# Patient Record
Sex: Female | Born: 1970 | Hispanic: No | Smoking: Never smoker
Health system: Southern US, Community
[De-identification: ages and names within clinical notes are randomized; demographics above are authoritative.]

## PROBLEM LIST (undated history)

## (undated) DIAGNOSIS — D649 Anemia, unspecified: Secondary | ICD-10-CM

## (undated) DIAGNOSIS — R519 Headache, unspecified: Secondary | ICD-10-CM

## (undated) DIAGNOSIS — F419 Anxiety disorder, unspecified: Secondary | ICD-10-CM

## (undated) DIAGNOSIS — K219 Gastro-esophageal reflux disease without esophagitis: Secondary | ICD-10-CM

## (undated) DIAGNOSIS — R51 Headache: Secondary | ICD-10-CM

## (undated) DIAGNOSIS — T8859XA Other complications of anesthesia, initial encounter: Secondary | ICD-10-CM

## (undated) DIAGNOSIS — N281 Cyst of kidney, acquired: Secondary | ICD-10-CM

## (undated) DIAGNOSIS — Z889 Allergy status to unspecified drugs, medicaments and biological substances status: Secondary | ICD-10-CM

## (undated) DIAGNOSIS — Z87442 Personal history of urinary calculi: Secondary | ICD-10-CM

## (undated) DIAGNOSIS — T4145XA Adverse effect of unspecified anesthetic, initial encounter: Secondary | ICD-10-CM

## (undated) HISTORY — PX: TUBAL LIGATION: SHX77

---

## 2014-09-13 ENCOUNTER — Emergency Department
Admission: EM | Admit: 2014-09-13 | Discharge: 2014-09-13 | Disposition: A | Discharge status: Home / Self Care | Payer: Self-pay | Attending: Emergency Medicine | Admitting: Emergency Medicine

## 2014-09-13 ENCOUNTER — Encounter: Payer: Self-pay | Admitting: Urgent Care

## 2014-09-13 ENCOUNTER — Emergency Department: Payer: Self-pay

## 2014-09-13 DIAGNOSIS — N200 Calculus of kidney: Secondary | ICD-10-CM | POA: Insufficient documentation

## 2014-09-13 DIAGNOSIS — Z3202 Encounter for pregnancy test, result negative: Secondary | ICD-10-CM | POA: Insufficient documentation

## 2014-09-13 DIAGNOSIS — R109 Unspecified abdominal pain: Secondary | ICD-10-CM

## 2014-09-13 LAB — BASIC METABOLIC PANEL
Anion gap: 6 (ref 5–15)
BUN: 13 mg/dL (ref 6–20)
CALCIUM: 9.1 mg/dL (ref 8.9–10.3)
CO2: 26 mmol/L (ref 22–32)
CREATININE: 0.85 mg/dL (ref 0.44–1.00)
Chloride: 109 mmol/L (ref 101–111)
Glucose, Bld: 109 mg/dL — ABNORMAL HIGH (ref 65–99)
POTASSIUM: 3.8 mmol/L (ref 3.5–5.1)
Sodium: 141 mmol/L (ref 135–145)

## 2014-09-13 LAB — URINALYSIS COMPLETE WITH MICROSCOPIC (ARMC ONLY)
BACTERIA UA: NONE SEEN
Bilirubin Urine: NEGATIVE
Glucose, UA: NEGATIVE mg/dL
Ketones, ur: NEGATIVE mg/dL
Leukocytes, UA: NEGATIVE
Nitrite: NEGATIVE
Protein, ur: NEGATIVE mg/dL
Specific Gravity, Urine: 1.02 (ref 1.005–1.030)
pH: 5 (ref 5.0–8.0)

## 2014-09-13 LAB — CBC
HEMATOCRIT: 37.6 % (ref 35.0–47.0)
HEMOGLOBIN: 12.4 g/dL (ref 12.0–16.0)
MCH: 29.6 pg (ref 26.0–34.0)
MCHC: 33 g/dL (ref 32.0–36.0)
MCV: 89.8 fL (ref 80.0–100.0)
Platelets: 203 10*3/uL (ref 150–440)
RBC: 4.18 MIL/uL (ref 3.80–5.20)
RDW: 13.5 % (ref 11.5–14.5)
WBC: 6.2 10*3/uL (ref 3.6–11.0)

## 2014-09-13 LAB — POCT PREGNANCY, URINE: PREG TEST UR: NEGATIVE

## 2014-09-13 MED ORDER — SODIUM CHLORIDE 0.9 % IV BOLUS (SEPSIS)
1000.0000 mL | Freq: Once | INTRAVENOUS | Status: AC
  Administered 2014-09-13: 1000 mL via INTRAVENOUS

## 2014-09-13 MED ORDER — OXYCODONE-ACETAMINOPHEN 5-325 MG PO TABS: 1.0000 | ORAL_TABLET | Freq: Four times a day (QID) | ORAL | Status: DC | PRN

## 2014-09-13 MED ORDER — KETOROLAC TROMETHAMINE 30 MG/ML IJ SOLN
INTRAMUSCULAR | Status: AC
  Administered 2014-09-13: 30 mg via INTRAVENOUS
  Filled 2014-09-13: qty 1

## 2014-09-13 MED ORDER — KETOROLAC TROMETHAMINE 30 MG/ML IJ SOLN
30.0000 mg | Freq: Once | INTRAMUSCULAR | Status: AC
  Administered 2014-09-13: 30 mg via INTRAVENOUS

## 2014-09-13 NOTE — ED Notes (Signed)
Patient returned from CT

## 2014-09-13 NOTE — ED Notes (Signed)
Patient resting in stretcher. Respirations even and unlabored. No obvious distress. No needs/concerns verbalized at this time. Call bell within reach. Encouraged to call with needs. Will continue to monitor. 

## 2014-09-13 NOTE — ED Provider Notes (Signed)
[facility] Emergency Department Provider Note  ____________________________________________  Time seen: Approximately 671 AM  I have reviewed the triage vital signs and the nursing notes.   HISTORY  Chief Complaint Flank Pain   HPI Nysa Sarin is a 44 y.o. female who reports that she woke up tonight and went to the bathroom. She reports that when she laid back down she developed some right-sided flank pain. The patient reports that the pain moved from her flank around to her abdomen. She reports that she feels as though she had to urinate. The patient has never had this pain before. When she arrived the pain was severe but reports now that the pain is improved with 3 out of 10 in intensity. The patient denies any hematuria denies any pain with urination but has felt some nausea. Not take anything for pain and reports that there is nothing that makes it worse she came in for further evaluation and treatment of the symptoms.   History reviewed. No pertinent past medical history.  There are no active problems to display for this patient.   Past surgical history Bilateral tubal ligation   Current Outpatient Rx  Name  Route  Sig  Dispense  Refill  . oxyCODONE-acetaminophen (ROXICET) 5-325 MG per tablet   Oral   Take 1 tablet by mouth every 6 (six) hours as needed.   12 tablet   0     Allergies Review of patient's allergies indicates no known allergies.  No family history on file.  Social History History  Substance Use Topics  . Smoking status: Never Smoker   . Smokeless tobacco: Not on file  . Alcohol Use: No    Review of Systems Constitutional: No fever/chills Eyes: No visual changes. ENT: No sore throat. Cardiovascular: Denies chest pain. Respiratory: Denies shortness of breath. Gastrointestinal: Nausea with right flank pain and right abdominal pain  Genitourinary: Urgency with no  dysuria. Musculoskeletal: Flank pain Skin: Negative  for rash. Neurological: Negative for headaches,   10-point ROS otherwise negative.  ____________________________________________   PHYSICAL EXAM:  VITAL SIGNS: ED Triage Vitals  Enc Vitals Group     BP 09/13/14 0522 129/90 mmHg     Pulse Rate 09/13/14 0522 71     Resp 09/13/14 0522 22     Temp 09/13/14 0522 98.6 F (37 C)     Temp Source 09/13/14 0522 Oral     SpO2 09/13/14 0522 98 %     Weight 09/13/14 0522 160 lb (72.576 kg)     Height 09/13/14 0522 5\' 5"  (1.651 m)     Head Cir --      Peak Flow --      Pain Score 09/13/14 0522 10     Pain Loc --      Pain Edu? --      Excl. in [place]? --     Constitutional: Alert and oriented. Well appearing and in mild distress. Eyes: Conjunctivae are normal. PERRL. EOMI. Head: Atraumatic. Nose: No congestion/rhinnorhea. Mouth/Throat: Mucous membranes are moist.  Oropharynx non-erythematous. Cardiovascular: Normal rate, regular rhythm. Grossly normal heart sounds.  Good peripheral circulation. Respiratory: Normal respiratory effort.  No retractions. Lungs CTAB. Gastrointestinal: Soft and nontender. No distention. CVA tenderness, right side tenderness. Genitourinary: Deferred Musculoskeletal: No lower extremity tenderness nor edema.  No joint effusions. Neurologic:  Normal speech and language. No gross focal neurologic deficits are appreciated.  Skin:  Skin is warm, dry and intact. No rash noted. Psychiatric: Mood and affect are  normal.   ____________________________________________   LABS (all labs ordered are listed, but only abnormal results are displayed)  Labs Reviewed  BASIC METABOLIC PANEL - Abnormal; Notable for the following:    Glucose, Bld 109 (*)    All other components within normal limits  URINALYSIS COMPLETEWITH MICROSCOPIC ([facility] ONLY) - Abnormal; Notable for the following:    Color, Urine YELLOW (*)    APPearance HAZY (*)    Hgb urine dipstick 1+ (*)    Squamous Epithelial / LPF 6-30 (*)    All other  components within normal limits  CBC  POCT PREGNANCY, URINE   ____________________________________________  EKG  None ____________________________________________  RADIOLOGY  CT renal stone study: Punctate 1-2 mm calcification along the posterior bladder wall possibly recently passed stone, no hydronephrosis. Punctate bilateral nephrolithiasis, 3.8 cm low density lesion within the right hepatic lobe cannot be characterized without IV contrast but is higher density than atypical cyst should be further evaluated with CT or MRI, low-density lesions within the kidneys bilaterally most likely cyst but cannot be characterized without IV contrast ____________________________________________   PROCEDURES  Procedure(s) performed: None  Critical Care performed: No  ____________________________________________   INITIAL IMPRESSION / ASSESSMENT AND PLAN / ED COURSE  Pertinent labs & imaging results that were available during my care of the patient were reviewed by me and considered in my medical decision making (see chart for details).  This is a 44 year old female who comes in with some right flank pain that started this evening. The pain is improved some but I will give the patient some normal saline as well as Toradol. The patient's blood work is not significant for elevated creatinine or elevated white blood cell count. On the CT the patient does appear to have passed a small stone. I will discharge the patient with some pain medicine as she may still have some dull ache but since the stone is out of her ureter the symptoms should improve. The patient be discharged to follow-up with her primary care physician and instructed on the renal stone diet of increasing her hydration decreasing caffeine and things with calcium oxalate ____________________________________________   FINAL CLINICAL IMPRESSION(S) / ED DIAGNOSES  Final diagnoses:  Right flank pain  Kidney stone      Loney Hering, MD 09/13/14 [phone]

## 2014-09-13 NOTE — ED Notes (Signed)
Patient presents this morning with c/o RIGHT lower back pain with (+) radiation into flank and groin. (+) nausea reported.

## 2014-09-13 NOTE — Discharge Instructions (Signed)
Flank Pain Flank pain refers to pain that is located on the side of the body between the upper abdomen and the back. The pain may occur over a short period of time (acute) or may be long-term or reoccurring (chronic). It may be mild or severe. Flank pain can be caused by many things. CAUSES  Some of the more common causes of flank pain include:  Muscle strains.   Muscle spasms.   A disease of your spine (vertebral disk disease).   A lung infection (pneumonia).   Fluid around your lungs (pulmonary edema).   A kidney infection.   Kidney stones.   A very painful skin rash caused by the chickenpox virus (shingles).   Gallbladder disease.  Willard care will depend on the cause of your pain. In general,  Rest as directed by your caregiver.  Drink enough fluids to keep your urine clear or pale yellow.  Only take over-the-counter or prescription medicines as directed by your caregiver. Some medicines may help relieve the pain.  Tell your caregiver about any changes in your pain.  Follow up with your caregiver as directed. SEEK IMMEDIATE MEDICAL CARE IF:   Your pain is not controlled with medicine.   You have new or worsening symptoms.  Your pain increases.   You have abdominal pain.   You have shortness of breath.   You have persistent nausea or vomiting.   You have swelling in your abdomen.   You feel faint or pass out.   You have blood in your urine.  You have a fever or persistent symptoms for more than 2-3 days.  You have a fever and your symptoms suddenly get worse. MAKE SURE YOU:   Understand these instructions.  Will watch your condition.  Will get help right away if you are not doing well or get worse. Document Released: 04/24/2005 Document Revised: 11/26/2011 Document Reviewed: 10/16/2011 [facility] Patient Information 2015 [place], [place]. This information is not intended to replace advice given to you by your  health care provider. Make sure you discuss any questions you have with your health care provider.  Kidney Stones Kidney stones (urolithiasis) are deposits that form inside your kidneys. The intense pain is caused by the stone moving through the urinary tract. When the stone moves, the ureter goes into spasm around the stone. The stone is usually passed in the urine.  CAUSES   A disorder that makes certain neck glands produce too much parathyroid hormone (primary hyperparathyroidism).  A buildup of uric acid crystals, similar to gout in your joints.  Narrowing (stricture) of the ureter.  A kidney obstruction present at birth (congenital obstruction).  Previous surgery on the kidney or ureters.  Numerous kidney infections. SYMPTOMS   Feeling sick to your stomach (nauseous).  Throwing up (vomiting).  Blood in the urine (hematuria).  Pain that usually spreads (radiates) to the groin.  Frequency or urgency of urination. DIAGNOSIS   Taking a history and physical exam.  Blood or urine tests.  CT scan.  Occasionally, an examination of the inside of the urinary bladder (cystoscopy) is performed. TREATMENT   Observation.  Increasing your fluid intake.  Extracorporeal shock wave lithotripsy--This is a noninvasive procedure that uses shock waves to break up kidney stones.  Surgery may be needed if you have severe pain or persistent obstruction. There are various surgical procedures. Most of the procedures are performed with the use of small instruments. Only small incisions are needed to accommodate these instruments,  so recovery time is minimized. The size, location, and chemical composition are all important variables that will determine the proper choice of action for you. Talk to your health care provider to better understand your situation so that you will minimize the risk of injury to yourself and your kidney.  HOME CARE INSTRUCTIONS   Drink enough water and fluids to  keep your urine clear or pale yellow. This will help you to pass the stone or stone fragments.  Strain all urine through the provided strainer. Keep all particulate matter and stones for your health care provider to see. The stone causing the pain may be as small as a grain of salt. It is very important to use the strainer each and every time you pass your urine. The collection of your stone will allow your health care provider to analyze it and verify that a stone has actually passed. The stone analysis will often identify what you can do to reduce the incidence of recurrences.  Only take over-the-counter or prescription medicines for pain, discomfort, or fever as directed by your health care provider.  Make a follow-up appointment with your health care provider as directed.  Get follow-up X-rays if required. The absence of pain does not always mean that the stone has passed. It may have only stopped moving. If the urine remains completely obstructed, it can cause loss of kidney function or even complete destruction of the kidney. It is your responsibility to make sure X-rays and follow-ups are completed. Ultrasounds of the kidney can show blockages and the status of the kidney. Ultrasounds are not associated with any radiation and can be performed easily in a matter of minutes. SEEK MEDICAL CARE IF:  You experience pain that is progressive and unresponsive to any pain medicine you have been prescribed. SEEK IMMEDIATE MEDICAL CARE IF:   Pain cannot be controlled with the prescribed medicine.  You have a fever or shaking chills.  The severity or intensity of pain increases over 18 hours and is not relieved by pain medicine.  You develop a new onset of abdominal pain.  You feel faint or pass out.  You are unable to urinate. MAKE SURE YOU:   Understand these instructions.  Will watch your condition.  Will get help right away if you are not doing well or get worse. Document Released:  03/03/2005 Document Revised: 11/03/2012 Document Reviewed: 08/04/2012 [facility] Patient Information 2015 [place], [place]. This information is not intended to replace advice given to you by your health care provider. Make sure you discuss any questions you have with your health care provider.  Dietary Guidelines to Help Prevent Kidney Stones Your risk of kidney stones can be decreased by adjusting the foods you eat. The most important thing you can do is drink enough fluid. You should drink enough fluid to keep your urine clear or pale yellow. The following guidelines provide specific information for the type of kidney stone you have had. GUIDELINES ACCORDING TO TYPE OF KIDNEY STONE Calcium Oxalate Kidney Stones  Reduce the amount of salt you eat. Foods that have a lot of salt cause your body to release excess calcium into your urine. The excess calcium can combine with a substance called oxalate to form kidney stones.  Reduce the amount of animal protein you eat if the amount you eat is excessive. Animal protein causes your body to release excess calcium into your urine. Ask your dietitian how much protein from animal sources you should be eating.  Avoid foods  that are high in oxalates. If you take vitamins, they should have less than 500 mg of vitamin C. Your body turns vitamin C into oxalates. You do not need to avoid fruits and vegetables high in vitamin C. Calcium Phosphate Kidney Stones  Reduce the amount of salt you eat to help prevent the release of excess calcium into your urine.  Reduce the amount of animal protein you eat if the amount you eat is excessive. Animal protein causes your body to release excess calcium into your urine. Ask your dietitian how much protein from animal sources you should be eating.  Get enough calcium from food or take a calcium supplement (ask your dietitian for recommendations). Food sources of calcium that do not increase your risk of kidney stones  include:  Broccoli.  Dairy products, such as cheese and yogurt.  Pudding. Uric Acid Kidney Stones  Do not have more than 6 oz of animal protein per day. FOOD SOURCES Animal Protein Sources  Meat (all types).  Poultry.  Eggs.  Fish, seafood. Foods High in [employer] seasonings.  Soy sauce.  Teriyaki sauce.  Cured and processed meats.  Salted crackers and snack foods.  Fast food.  Canned soups and most canned foods. Foods High in Oxalates  Grains:  Amaranth.  Barley.  Grits.  Wheat germ.  Bran.  Buckwheat flour.  All bran cereals.  Pretzels.  Whole wheat bread.  Vegetables:  Beans (wax).  Beets and beet greens.  Collard greens.  Eggplant.  [place].  Leeks.  Okra.  Parsley.  [place]abagas.  Spinach.  Swiss chard.  Tomato paste.  Fried potatoes.  Sweet potatoes.  Fruits:  Red currants.  Figs.  Kiwi.  Rhubarb.  Meat and Other Protein Sources:  Beans (dried).  Soy burgers and other soybean products.  Miso.  Nuts (peanuts, almonds, pecans, cashews, hazelnuts).  Nut butters.  Sesame seeds and tahini (paste made of sesame seeds).  Poppy seeds.  Beverages:  Chocolate drink mixes.  Soy milk.  Instant iced tea.  Juices made from high-oxalate fruits or vegetables.  Other:  Carob.  Chocolate.  Fruitcake.  Marmalades. Document Released: 06/28/2010 Document Revised: 03/08/2013 Document Reviewed: 01/28/2013 [facility] Patient Information 2015 [place], [place]. This information is not intended to replace advice given to you by your health care provider. Make sure you discuss any questions you have with your health care provider.

## 2015-03-07 ENCOUNTER — Emergency Department
Admission: EM | Admit: 2015-03-07 | Discharge: 2015-03-07 | Disposition: A | Discharge status: Home / Self Care | Payer: No Typology Code available for payment source | Attending: Student | Admitting: Student

## 2015-03-07 ENCOUNTER — Encounter: Payer: Self-pay | Admitting: Emergency Medicine

## 2015-03-07 DIAGNOSIS — S161XXA Strain of muscle, fascia and tendon at neck level, initial encounter: Secondary | ICD-10-CM | POA: Insufficient documentation

## 2015-03-07 DIAGNOSIS — S4991XA Unspecified injury of right shoulder and upper arm, initial encounter: Secondary | ICD-10-CM | POA: Diagnosis not present

## 2015-03-07 DIAGNOSIS — S59902A Unspecified injury of left elbow, initial encounter: Secondary | ICD-10-CM | POA: Insufficient documentation

## 2015-03-07 DIAGNOSIS — Y92410 Unspecified street and highway as the place of occurrence of the external cause: Secondary | ICD-10-CM | POA: Diagnosis not present

## 2015-03-07 DIAGNOSIS — Y998 Other external cause status: Secondary | ICD-10-CM | POA: Insufficient documentation

## 2015-03-07 DIAGNOSIS — S4992XA Unspecified injury of left shoulder and upper arm, initial encounter: Secondary | ICD-10-CM | POA: Diagnosis not present

## 2015-03-07 DIAGNOSIS — Y9389 Activity, other specified: Secondary | ICD-10-CM | POA: Insufficient documentation

## 2015-03-07 DIAGNOSIS — S39012A Strain of muscle, fascia and tendon of lower back, initial encounter: Secondary | ICD-10-CM | POA: Diagnosis not present

## 2015-03-07 DIAGNOSIS — S199XXA Unspecified injury of neck, initial encounter: Secondary | ICD-10-CM | POA: Diagnosis present

## 2015-03-07 MED ORDER — TRAMADOL HCL 50 MG PO TABS: 50.0000 mg | ORAL_TABLET | Freq: Four times a day (QID) | ORAL | Status: AC | PRN

## 2015-03-07 MED ORDER — KETOROLAC TROMETHAMINE 60 MG/2ML IM SOLN
60.0000 mg | Freq: Once | INTRAMUSCULAR | Status: AC
  Administered 2015-03-07: 60 mg via INTRAMUSCULAR
  Filled 2015-03-07: qty 2

## 2015-03-07 MED ORDER — IBUPROFEN 800 MG PO TABS
800.0000 mg | ORAL_TABLET | Freq: Three times a day (TID) | ORAL | Status: DC | PRN
Start: 2015-03-07 — End: 2017-08-20

## 2015-03-07 MED ORDER — METHOCARBAMOL 750 MG PO TABS: 1500.0000 mg | ORAL_TABLET | Freq: Four times a day (QID) | ORAL | Status: DC
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
## 2015-03-07 NOTE — Discharge Instructions (Signed)
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
## 2016-12-28 ENCOUNTER — Encounter (HOSPITAL_COMMUNITY): Payer: Self-pay

## 2016-12-28 ENCOUNTER — Emergency Department (HOSPITAL_COMMUNITY)
Admission: EM | Admit: 2016-12-28 | Discharge: 2016-12-28 | Disposition: A | Discharge status: Home / Self Care | Payer: Self-pay | Attending: Emergency Medicine | Admitting: Emergency Medicine

## 2016-12-28 DIAGNOSIS — G43009 Migraine without aura, not intractable, without status migrainosus: Secondary | ICD-10-CM | POA: Insufficient documentation

## 2016-12-28 MED ORDER — METOCLOPRAMIDE HCL 5 MG/ML IJ SOLN
10.0000 mg | Freq: Once | INTRAMUSCULAR | Status: AC
  Administered 2016-12-28: 10 mg via INTRAVENOUS

## 2016-12-28 MED ORDER — SODIUM CHLORIDE 0.9 % IV BOLUS (SEPSIS)
1000.0000 mL | Freq: Once | INTRAVENOUS | Status: AC
  Administered 2016-12-28: 1000 mL via INTRAVENOUS

## 2016-12-28 MED ORDER — KETOROLAC TROMETHAMINE 15 MG/ML IJ SOLN
15.0000 mg | Freq: Once | INTRAMUSCULAR | Status: AC
  Administered 2016-12-28: 15 mg via INTRAVENOUS
  Filled 2016-12-28: qty 1

## 2016-12-28 MED ORDER — DIPHENHYDRAMINE HCL 50 MG/ML IJ SOLN
25.0000 mg | Freq: Once | INTRAMUSCULAR | Status: AC
  Administered 2016-12-28: 25 mg via INTRAVENOUS
  Filled 2016-12-28: qty 1

## 2016-12-28 MED ORDER — METOCLOPRAMIDE HCL 5 MG/ML IJ SOLN
5.0000 mg | Freq: Once | INTRAMUSCULAR | Status: DC
  Filled 2016-12-28: qty 2

## 2016-12-28 NOTE — Discharge Instructions (Signed)
Please establish a primary care physician.  Return to the ED for worsening symptoms as needed.

## 2016-12-28 NOTE — ED Triage Notes (Signed)
Patient here with complaint of migraine headache x 1 week with photophobia with mild nausea. Seen at an urgent cared yesterday and received toradol with minimal relief. Alert and oriented

## 2016-12-28 NOTE — ED Provider Notes (Signed)
[facility] DEPT Provider Note   CSN: [id number] Arrival date & time: 12/28/16  1236  History   Chief Complaint No chief complaint on file.  HPI Kristelle Cavallaro is a 46 y.o. female.  The patient is a 46yo female with a medical history significant for migraine headaches who presents to the ED complaining of a headache.  The patient typically has 2 migraines each month last 3-4 days. This headache has been present for the past 5 days with no relief of symptoms. The headache started in her left frontal region, and now has radiated to the right frontal region, jaw, and neck.  She took Excedrin yesterday with no relief. 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  Today's headache feels similar to her previous headaches. She reports mild photophobia and chills.  No significant neck stiffness or fever.  No weakness or numbness.   The history is provided by the patient and medical records. No language interpreter was used.   History reviewed. No pertinent past medical history.  There are no active problems to display for this patient.  History reviewed. No pertinent surgical history.  OB History    No data available     Home Medications    Prior to Admission medications   Medication Sig Start Date End Date Taking? Authorizing Provider  aspirin-acetaminophen-caffeine (EXCEDRIN MIGRAINE) [phone] MG tablet Take 2 tablets by mouth every 6 (six) hours as needed for headache or migraine.   Yes [provider]  ibuprofen (ADVIL,MOTRIN) 800 MG tablet Take 1 tablet (800 mg total) by mouth every 8 (eight) hours as needed. Patient not taking: Reported on 12/28/2016 03/07/15   Sable Feil, PA-C  methocarbamol (ROBAXIN-750) 750 MG tablet Take 2 tablets (1,500 mg total) by mouth 4 (four) times daily. Patient not taking: Reported on 12/28/2016 03/07/15   Sable Feil, PA-C  oxyCODONE-acetaminophen (ROXICET)  5-325 MG per tablet Take 1 tablet by mouth every 6 (six) hours as needed. Patient not taking: Reported on 12/28/2016 09/13/14   Loney Hering, MD   Family History No family history on file.  Social History Social History  Substance Use Topics  . Smoking status: Never Smoker  . Smokeless tobacco: Not on file  . Alcohol use No   Allergies   Patient has no known allergies.  Review of Systems Review of Systems  Constitutional: Negative for chills and fever.  HENT: Negative for ear pain and sore throat.   Eyes: Negative for pain and visual disturbance.  Respiratory: Negative for cough and shortness of breath.   Cardiovascular: Negative for chest pain and palpitations.  Gastrointestinal: Negative for abdominal pain and vomiting.  Genitourinary: Negative for decreased urine volume, dysuria and hematuria.  Musculoskeletal: Negative for arthralgias and back pain.  Skin: Negative for color change and rash.  Allergic/Immunologic: Negative for immunocompromised state.  Neurological: Positive for headaches. Negative for seizures, syncope and weakness.  Hematological: Negative.   Psychiatric/Behavioral: Negative.   All other systems reviewed and are negative.  Physical Exam Updated Vital Signs BP 108/65   Pulse 69   Temp 97.9 F (36.6 C) (Oral)   Resp 16   SpO2 100%   Physical Exam  Constitutional: She is oriented to person, place, and time. She appears well-developed and well-nourished. No distress.  HENT:  Head: Normocephalic and atraumatic.  Mouth/Throat: Oropharynx is clear and moist. No oropharyngeal exudate.  Eyes: Pupils are equal, round, and reactive  to light. Conjunctivae and EOM are normal.  Neck: Neck supple.  Cardiovascular: Normal rate, regular rhythm, normal heart sounds and intact distal pulses.   No murmur heard. Pulmonary/Chest: Effort normal and breath sounds normal. No stridor. No respiratory distress. She has no wheezes.  Abdominal: Soft. There is no  tenderness.  Musculoskeletal: She exhibits no edema.  Neurological: She is alert and oriented to person, place, and time. She displays normal reflexes. No cranial nerve deficit or sensory deficit. She exhibits normal muscle tone. Coordination normal.  Skin: Skin is warm and dry. Capillary refill takes less than 2 seconds.  Psychiatric: She has a normal mood and affect. Her behavior is normal. Judgment and thought content normal.  Nursing note and vitals reviewed.  ED Treatments / Results  Labs (all labs ordered are listed, but only abnormal results are displayed) Labs Reviewed - No data to display  EKG  EKG Interpretation None      Radiology No results found.  Procedures Procedures (including critical care time)  Medications Ordered in ED Medications  sodium chloride 0.9 % bolus 1,000 mL (0 mLs Intravenous Stopped 12/28/16 1823)  ketorolac (TORADOL) 15 MG/ML injection 15 mg (15 mg Intravenous Given 12/28/16 1658)  diphenhydrAMINE (BENADRYL) injection 25 mg (25 mg Intravenous Given 12/28/16 1657)  metoCLOPramide (REGLAN) injection 10 mg (10 mg Intravenous Given 12/28/16 1700)   Initial Impression / Assessment and Plan / ED Course  I have reviewed the triage vital signs and the nursing notes.  Pertinent labs & imaging results that were available during my care of the patient were reviewed by me and considered in my medical decision making (see chart for details).    Initial differential diagnosis included tension headache, migraine headache, cluster headache, electrolyte abnormality, and dehydration.  The patient was given a migraine cocktail for her symptoms that included IV fluids, Toradol, Benadryl, and Reglan.  Upon reassessment, her headache was "100 times better."  She continued to complain of pressure in her right frontal and temporal area, however she was ambulating without assistance and her photophobia had resolved.  Based on the above findings, I suspect the patient  was suffering from a migraine headache.  I discussed the above results with the patient who verbalized understanding.  Return precautions and follow-up plans discussed including the importance of establishing a PCP (she recently relocated back to [place] from [place]).  The patient was discharged in stable condition.  Final Clinical Impressions(s) / ED Diagnoses   Final diagnoses:  Migraine without aura and without status migrainosus, not intractable   New Prescriptions New Prescriptions   No medications on file     Charisse March, MD 12/28/16 Donney Dice, MD 12/31/16 [phone]

## 2017-08-20 ENCOUNTER — Other Ambulatory Visit: Payer: Self-pay

## 2017-08-20 ENCOUNTER — Emergency Department: Payer: BLUE CROSS/BLUE SHIELD

## 2017-08-20 ENCOUNTER — Emergency Department
Admission: EM | Admit: 2017-08-20 | Discharge: 2017-08-20 | Disposition: A | Discharge status: Home / Self Care | Payer: BLUE CROSS/BLUE SHIELD | Attending: Surgery | Admitting: Surgery

## 2017-08-20 DIAGNOSIS — R1013 Epigastric pain: Secondary | ICD-10-CM

## 2017-08-20 DIAGNOSIS — R112 Nausea with vomiting, unspecified: Secondary | ICD-10-CM | POA: Insufficient documentation

## 2017-08-20 DIAGNOSIS — R1011 Right upper quadrant pain: Secondary | ICD-10-CM | POA: Insufficient documentation

## 2017-08-20 DIAGNOSIS — K8050 Calculus of bile duct without cholangitis or cholecystitis without obstruction: Secondary | ICD-10-CM | POA: Insufficient documentation

## 2017-08-20 DIAGNOSIS — R109 Unspecified abdominal pain: Secondary | ICD-10-CM | POA: Diagnosis present

## 2017-08-20 LAB — COMPREHENSIVE METABOLIC PANEL
ALT: 15 U/L (ref 14–54)
ANION GAP: 11 (ref 5–15)
AST: 18 U/L (ref 15–41)
Albumin: 4 g/dL (ref 3.5–5.0)
Alkaline Phosphatase: 60 U/L (ref 38–126)
BUN: 15 mg/dL (ref 6–20)
CO2: 15 mmol/L — ABNORMAL LOW (ref 22–32)
Calcium: 8.7 mg/dL — ABNORMAL LOW (ref 8.9–10.3)
Chloride: 110 mmol/L (ref 101–111)
Creatinine, Ser: 0.64 mg/dL (ref 0.44–1.00)
GFR calc non Af Amer: >60 mL/min (ref >60)
Glucose, Bld: 116 mg/dL — ABNORMAL HIGH (ref 65–99)
Potassium: 3.7 mmol/L (ref 3.5–5.1)
Sodium: 136 mmol/L (ref 135–145)
Total Bilirubin: 0.5 mg/dL (ref 0.3–1.2)
Total Protein: 7.4 g/dL (ref 6.5–8.1)

## 2017-08-20 LAB — CBC WITH DIFFERENTIAL/PLATELET
Basophils Absolute: 0.1 10*3/uL (ref 0–0.1)
Basophils Relative: 1 %
EOS ABS: 0.3 10*3/uL (ref 0–0.7)
EOS PCT: 4 %
HCT: 39.4 % (ref 35.0–47.0)
Hemoglobin: 13.6 g/dL (ref 12.0–16.0)
LYMPHS ABS: 2.4 10*3/uL (ref 1.0–3.6)
LYMPHS PCT: 33 %
MCH: 30.8 pg (ref 26.0–34.0)
MCHC: 34.5 g/dL (ref 32.0–36.0)
MCV: 89.5 fL (ref 80.0–100.0)
MONOS PCT: 6 %
Monocytes Absolute: 0.4 10*3/uL (ref 0.2–0.9)
Neutro Abs: 4 10*3/uL (ref 1.4–6.5)
Neutrophils Relative %: 56 %
PLATELETS: 213 10*3/uL (ref 150–440)
RBC: 4.4 MIL/uL (ref 3.80–5.20)
RDW: 13.6 % (ref 11.5–14.5)
WBC: 7.2 10*3/uL (ref 3.6–11.0)

## 2017-08-20 LAB — TROPONIN I

## 2017-08-20 LAB — LIPASE, BLOOD: LIPASE: 29 U/L (ref 11–51)

## 2017-08-20 MED ORDER — PROMETHAZINE HCL 12.5 MG PO TABS: 12.5000 mg | ORAL_TABLET | Freq: Four times a day (QID) | ORAL | 0 refills | Status: DC | PRN

## 2017-08-20 MED ORDER — PROMETHAZINE HCL 25 MG/ML IJ SOLN
12.5000 mg | Freq: Four times a day (QID) | INTRAMUSCULAR | Status: DC | PRN
  Administered 2017-08-20: 12.5 mg via INTRAVENOUS
  Filled 2017-08-20: qty 1

## 2017-08-20 MED ORDER — KETOROLAC TROMETHAMINE 30 MG/ML IJ SOLN
15.0000 mg | Freq: Once | INTRAMUSCULAR | Status: AC
  Administered 2017-08-20: 15 mg via INTRAVENOUS
  Filled 2017-08-20: qty 1

## 2017-08-20 MED ORDER — HYDROCODONE-ACETAMINOPHEN 5-325 MG PO TABS: 1.0000 | ORAL_TABLET | ORAL | 0 refills | Status: DC | PRN

## 2017-08-20 MED ORDER — FENTANYL CITRATE (PF) 100 MCG/2ML IJ SOLN
50.0000 ug | INTRAMUSCULAR | Status: DC | PRN
  Administered 2017-08-20: 50 ug via INTRAVENOUS
  Filled 2017-08-20: qty 2

## 2017-08-20 MED ORDER — NAPROXEN 500 MG PO TABS: 500.0000 mg | ORAL_TABLET | Freq: Two times a day (BID) | ORAL | 0 refills | Status: AC

## 2017-08-20 NOTE — ED Notes (Signed)
PT unable to void at this time, will continue to monitor for urine sample

## 2017-08-20 NOTE — ED Notes (Signed)
Pt still unable to provide urine sample, pt states she had tubal ligation and denies any suspicion  of preg

## 2017-08-20 NOTE — ED Provider Notes (Signed)
[facility] Emergency Department Provider Note    First MD Initiated Contact with Patient 08/20/17 1006     (approximate)  I have reviewed the triage vital signs and the nursing notes.   HISTORY  Chief Complaint Abdominal Pain    HPI Michelle Hebert is a 47 y.o. female with a history of kidney stones presents with chief complaint of midepigastric crampy abdominal pain radiating to right upper quadrant and right flank.  Has never had pain like this before.  States that she started feeling over the past few days became more significant this morning.  Did have nausea with an episode of vomit.  Denies any fevers.  No chest pain or shortness of breath.  States the pain is currently mild.    History reviewed. No pertinent past medical history. No family history on file. Past Surgical History:  Procedure Laterality Date  . TUBAL LIGATION     There are no active problems to display for this patient.     Prior to Admission medications   Medication Sig Start Date End Date Taking? Authorizing Provider  HYDROcodone-acetaminophen (NORCO) 5-325 MG tablet Take 1 tablet by mouth every 4 (four) hours as needed for moderate pain. 08/20/17   Merlyn Lot, MD  naproxen (NAPROSYN) 500 MG tablet Take 1 tablet (500 mg total) by mouth 2 (two) times daily with a meal. 08/20/17 08/20/18  Merlyn Lot, MD  promethazine (PHENERGAN) 12.5 MG tablet Take 1 tablet (12.5 mg total) by mouth every 6 (six) hours as needed for nausea or vomiting. 08/20/17   Merlyn Lot, MD    Allergies Patient has no known allergies.    Social History Social History   Tobacco Use  . Smoking status: Never Smoker  . Smokeless tobacco: Never Used  Substance Use Topics  . Alcohol use: No  . Drug use: Not on file    Review of Systems Patient denies headaches, rhinorrhea, blurry vision, numbness, shortness of breath, chest pain, edema, cough, abdominal pain, nausea, vomiting, diarrhea,  dysuria, fevers, rashes or hallucinations unless otherwise stated above in HPI. ____________________________________________   PHYSICAL EXAM:  VITAL SIGNS: Vitals:   08/20/17 1002 08/20/17 1200  BP: 138/88   Pulse: 88 70  Resp: 18 16  Temp: 98.1 F (36.7 C)   SpO2: 100% 98%    Constitutional: Alert and oriented.  Eyes: Conjunctivae are normal.  Head: Atraumatic. Nose: No congestion/rhinnorhea. Mouth/Throat: Mucous membranes are moist.   Neck: No stridor. Painless ROM.  Cardiovascular: Normal rate, regular rhythm. Grossly normal heart sounds.  Good peripheral circulation. Respiratory: Normal respiratory effort.  No retractions. Lungs CTAB. Gastrointestinal: Soft and nontender. No distention. No abdominal bruits. No CVA tenderness. Genitourinary:  Musculoskeletal: No lower extremity tenderness nor edema.  No joint effusions. Neurologic:  Normal speech and language. No gross focal neurologic deficits are appreciated. No facial droop Skin:  Skin is warm, dry and intact. No rash noted. Psychiatric: Mood and affect are normal. Speech and behavior are normal.  ____________________________________________   LABS (all labs ordered are listed, but only abnormal results are displayed)  Results for orders placed or performed during the hospital encounter of 08/20/17 (from the past 24 hour(s))  CBC with Differential/Platelet     Status: None   Collection Time: 08/20/17 10:17 AM  Result Value Ref Range   WBC 7.2 3.6 - 11.0 K/uL   RBC 4.40 3.80 - 5.20 MIL/uL   Hemoglobin 13.6 12.0 - 16.0 g/dL   HCT 39.4 35.0 - 47.0 %  MCV 89.5 80.0 - 100.0 fL   MCH 30.8 26.0 - 34.0 pg   MCHC 34.5 32.0 - 36.0 g/dL   RDW 13.6 11.5 - 14.5 %   Platelets 213 150 - 440 K/uL   Neutrophils Relative % 56 %   Neutro Abs 4.0 1.4 - 6.5 K/uL   Lymphocytes Relative 33 %   Lymphs Abs 2.4 1.0 - 3.6 K/uL   Monocytes Relative 6 %   Monocytes Absolute 0.4 0.2 - 0.9 K/uL   Eosinophils Relative 4 %    Eosinophils Absolute 0.3 0 - 0.7 K/uL   Basophils Relative 1 %   Basophils Absolute 0.1 0 - 0.1 K/uL  Comprehensive metabolic panel     Status: Abnormal   Collection Time: 08/20/17 10:17 AM  Result Value Ref Range   Sodium 136 135 - 145 mmol/L   Potassium 3.7 3.5 - 5.1 mmol/L   Chloride 110 101 - 111 mmol/L   CO2 15 (L) 22 - 32 mmol/L   Glucose, Bld 116 (H) 65 - 99 mg/dL   BUN 15 6 - 20 mg/dL   Creatinine, Ser 0.64 0.44 - 1.00 mg/dL   Calcium 8.7 (L) 8.9 - 10.3 mg/dL   Total Protein 7.4 6.5 - 8.1 g/dL   Albumin 4.0 3.5 - 5.0 g/dL   AST 18 15 - 41 U/L   ALT 15 14 - 54 U/L   Alkaline Phosphatase 60 38 - 126 U/L   Total Bilirubin 0.5 0.3 - 1.2 mg/dL   GFR calc non Af Amer >60 >60 mL/min   GFR calc Af Amer >60 >60 mL/min   Anion gap 11 5 - 15  Lipase, blood     Status: None   Collection Time: 08/20/17 10:17 AM  Result Value Ref Range   Lipase 29 11 - 51 U/L  Troponin I     Status: None   Collection Time: 08/20/17 10:26 AM  Result Value Ref Range   Troponin I <0.03 <0.03 ng/mL   ____________________________________________  EKG My review and personal interpretation at Time: 10:41   Indication: abd pain  Rate: 80  Rhythm: sinus Axis: normal Other: normal intervals, no stemi ____________________________________________  RADIOLOGY  I personally reviewed all radiographic images ordered to evaluate for the above acute complaints and reviewed radiology reports and findings.  These findings were personally discussed with the patient.  Please see medical record for radiology report.  ____________________________________________   PROCEDURES  Procedure(s) performed:  Procedures    Critical Care performed: no ____________________________________________   INITIAL IMPRESSION / ASSESSMENT AND PLAN / ED COURSE  Pertinent labs & imaging results that were available during my care of the patient were reviewed by me and considered in my medical decision making (see chart for  details).   DDX: chollithiasis, cholecysitits, uti, stone, pna  Michelle Hebert is a 47 y.o. who presents to the ED with symptoms as described above.  Based on patient's presentation will order ultrasound to evaluate for cholelithiasis cholecystitis.  Will provide IV pain medication and reassess.  Blood work is otherwise reassuring.  Clinical Course as of Aug 20 1349  Thu Aug 20, 2017  1321 Right upper quadrant ultrasound shows evidence of large biliary stone but no evidence of pericholecystic fluid or edema.  Common bile duct within normal limits.  Patient tolerated sips of water.  Pain is currently controlled.  No evidence of acute cholecystitis at this time.   [PR]  1351 Patient stable and appropriate for outpatient follow-up.   [PR]  Clinical Course User Index [PR] Merlyn Lot, MD     As part of my medical decision making, I reviewed the following data within the [place] notes reviewed and incorporated, Labs reviewed, notes from prior ED visits and [place] Controlled Substance Database   ____________________________________________   FINAL CLINICAL IMPRESSION(S) / ED DIAGNOSES  Final diagnoses:  Epigastric abdominal pain  Biliary colic      NEW MEDICATIONS STARTED DURING THIS VISIT:  New Prescriptions   HYDROCODONE-ACETAMINOPHEN (NORCO) 5-325 MG TABLET    Take 1 tablet by mouth every 4 (four) hours as needed for moderate pain.   NAPROXEN (NAPROSYN) 500 MG TABLET    Take 1 tablet (500 mg total) by mouth 2 (two) times daily with a meal.   PROMETHAZINE (PHENERGAN) 12.5 MG TABLET    Take 1 tablet (12.5 mg total) by mouth every 6 (six) hours as needed for nausea or vomiting.     Note:  This document was prepared using Dragon voice recognition software and may include unintentional dictation errors.    Merlyn Lot, MD 08/20/17 1351

## 2017-08-20 NOTE — ED Notes (Signed)
Water given at this time for PO challenge, will continue to monitor

## 2017-08-20 NOTE — ED Triage Notes (Signed)
Pt c/o epigastric pain that radiates around into the right into the back since yesterday with nausea. Denies vomiting or diarrhea.

## 2017-08-20 NOTE — ED Notes (Signed)
Pt denies any nausea with PO challenge, MD awre

## 2017-09-04 ENCOUNTER — Other Ambulatory Visit: Payer: Self-pay

## 2017-09-09 ENCOUNTER — Ambulatory Visit (INDEPENDENT_AMBULATORY_CARE_PROVIDER_SITE_OTHER): Payer: BLUE CROSS/BLUE SHIELD | Admitting: Surgery

## 2017-09-09 ENCOUNTER — Encounter: Payer: Self-pay | Admitting: Surgery

## 2017-09-09 VITALS — BP 136/86 | HR 85 | Temp 97.7°F | Ht 64.0 in | Wt 190.4 lb

## 2017-09-09 DIAGNOSIS — K8020 Calculus of gallbladder without cholecystitis without obstruction: Secondary | ICD-10-CM | POA: Diagnosis not present

## 2017-09-09 NOTE — Progress Notes (Signed)
Michelle Hebert is an 47 y.o. female.   Chief Complaint: Epigastric pain Consult requested by emergency room physician HPI: This patient has been in the emergency room with epigastric pain her work-up showing gallstones. Twenty days ago in ED.Postprandial pain with FFI. History reviewed. No pertinent past medical history.  Past Surgical History:  Procedure Laterality Date  . TUBAL LIGATION    . TUBAL LIGATION     Mult family members with GB problems. Family History  Problem Relation Age of Onset  . Diabetes Mother   . Diverticulitis Mother   . Multiple sclerosis Mother   . Dystonia Mother   . Diabetes Maternal Grandmother   . Heart disease Maternal Grandmother   . Stroke Maternal Grandmother    Social History:  reports that she has never smoked. She has never used smokeless tobacco. She reports that she does not drink alcohol. Her drug history is not on file.  Allergies: No Known Allergies   (Not in a hospital admission)   Review of Systems:   Review of Systems  Constitutional: Negative.   HENT: Negative.   Eyes: Negative.   Respiratory: Negative.   Cardiovascular: Negative.   Gastrointestinal: Positive for abdominal pain and nausea. Negative for blood in stool, constipation, diarrhea, heartburn, melena and vomiting.  Genitourinary: Negative.   Musculoskeletal: Negative.   Skin: Negative.   Neurological: Negative.   Endo/Heme/Allergies: Negative.   Psychiatric/Behavioral: Negative.     Physical Exam:  Physical Exam  Constitutional: She is oriented to person, place, and time. She appears well-developed and well-nourished. No distress.  HENT:  Head: Normocephalic and atraumatic.  Eyes: Pupils are equal, round, and reactive to light. EOM are normal. Right eye exhibits no discharge. Left eye exhibits no discharge. No scleral icterus.  Neck: Normal range of motion. Neck supple.  Cardiovascular: Normal rate and regular rhythm.  Pulmonary/Chest: Effort normal and  breath sounds normal. No respiratory distress.  Abdominal: Soft. She exhibits no distension and no mass. There is no tenderness. There is no guarding.  Infraumbilical laparoscopy scar  Musculoskeletal: Normal range of motion. She exhibits no edema or deformity.  Neurological: She is alert and oriented to person, place, and time.  Skin: Skin is warm and dry. She is not diaphoretic. No erythema. No pallor.  Psychiatric: She has a normal mood and affect. Judgment normal.  Vitals reviewed.   Blood pressure 136/86, pulse 85, temperature 97.7 F (36.5 C), temperature source Oral, height 5\' 4"  (1.626 m), weight 190 lb 6.4 oz (86.4 kg).    No results found for this or any previous visit (from the past 48 hour(s)). No results found.   Assessment/Plan Ultrasound shows stones with no thickening of the gallbladder wall.  Liver function tests are normal. Symptomatic cholelithiasis.  Recommend laparoscopic cholecystectomy for control of her symptoms.  The rationale for this was discussed.  The options of observation reviewed and the risk of bleeding infection recurrence of symptoms failure to resolve all her symptoms conversion to an open procedure bile duct damage bile duct leak retained common bile duct stone in it which could require further surgery were all reviewed with her she understood and agreed with this plan.  This patient gives a family history of gallbladder cancer in her great grandmother and her grandmother on her mother side and in fact states that her mother might have had gallbladder cancer but was never treated for it other than cholecystectomy.  These are suspicious suggestions and while we must be concerned about gallbladder cancer it  would be unusual.  I also question when they were diagnosed and she said roughly in their 18s.  That is unlikely to be gallbladder cancer in my estimation.  However I did discuss the potential for finding gallbladder cancer and the need for a liver  resection at one point if that were to be identified but there was no sign of that on the ultrasound either.  He understood and agreed with plan for laparoscopic cholecystectomy for control of her symptoms. Florene Glen, MD, FACS

## 2017-09-09 NOTE — Patient Instructions (Signed)
You have requested to have your gallbladder removed. This will be done on 09/18/17 at [facility] with Dr. Burt Knack.  You will most likely be out of work 1-2 weeks for this surgery. You will return after your post-op appointment with a lifting restriction for approximately 4 more weeks.  You will be able to eat anything you would like to following surgery. But, start by eating a bland diet and advance this as tolerated. The Gallbladder diet is below, please go as closely by this diet as possible prior to surgery to avoid any further attacks.  Please see the (blue)pre-care form that you have been given today. If you have any questions, please call our office.  Laparoscopic Cholecystectomy Laparoscopic cholecystectomy is surgery to remove the gallbladder. The gallbladder is located in the upper right part of the abdomen, behind the liver. It is a storage sac for bile, which is produced in the liver. Bile aids in the digestion and absorption of fats. Cholecystectomy is often done for inflammation of the gallbladder (cholecystitis). This condition is usually caused by a buildup of gallstones (cholelithiasis) in the gallbladder. Gallstones can block the flow of bile, and that can result in inflammation and pain. In severe cases, emergency surgery may be required. If emergency surgery is not required, you will have time to prepare for the procedure. Laparoscopic surgery is an alternative to open surgery. Laparoscopic surgery has a shorter recovery time. Your common bile duct may also need to be examined during the procedure. If stones are found in the common bile duct, they may be removed. LET [facility] CARE PROVIDER KNOW ABOUT:  Any allergies you have.  All medicines you are taking, including vitamins, herbs, eye drops, creams, and over-the-counter medicines.  Previous problems you or members of your family have had with the use of anesthetics.  Any blood disorders you have.  Previous surgeries  you have had.    Any medical conditions you have. RISKS AND COMPLICATIONS Generally, this is a safe procedure. However, problems may occur, including:  Infection.  Bleeding.  Allergic reactions to medicines.  Damage to other structures or organs.  A stone remaining in the common bile duct.  A bile leak from the cyst duct that is clipped when your gallbladder is removed.  The need to convert to open surgery, which requires a larger incision in the abdomen. This may be necessary if your surgeon thinks that it is not safe to continue with a laparoscopic procedure. BEFORE THE PROCEDURE  Ask your health care provider about:  Changing or stopping your regular medicines. This is especially important if you are taking diabetes medicines or blood thinners.  Taking medicines such as aspirin and ibuprofen. These medicines can thin your blood. Do not take these medicines before your procedure if your health care provider instructs you not to.  Follow instructions from your health care provider about eating or drinking restrictions.  Let your health care provider know if you develop a cold or an infection before surgery.  Plan to have someone take you home after the procedure.  Ask your health care provider how your surgical site will be marked or identified.  You may be given antibiotic medicine to help prevent infection. PROCEDURE  To reduce your risk of infection:  Your health care team will wash or sanitize their hands.  Your skin will be washed with soap.  An IV tube may be inserted into one of your veins.  You will be given a medicine to make  you fall asleep (general anesthetic).  A breathing tube will be placed in your mouth.  The surgeon will make several small cuts (incisions) in your abdomen.  A thin, lighted tube (laparoscope) that has a tiny camera on the end will be inserted through one of the small incisions. The camera on the laparoscope will send a picture to  a TV screen (monitor) in the operating room. This will give the surgeon a good view inside your abdomen.  A gas will be pumped into your abdomen. This will expand your abdomen to give the surgeon more room to perform the surgery.  Other tools that are needed for the procedure will be inserted through the other incisions. The gallbladder will be removed through one of the incisions.  After your gallbladder has been removed, the incisions will be closed with stitches (sutures), staples, or skin glue.  Your incisions may be covered with a bandage (dressing). The procedure may vary among health care providers and hospitals. AFTER THE PROCEDURE  Your blood pressure, heart rate, breathing rate, and blood oxygen level will be monitored often until the medicines you were given have worn off.  You will be given medicines as needed to control your pain.   This information is not intended to replace advice given to you by your health care provider. Make sure you discuss any questions you have with your health care provider.   Document Released: 03/03/2005 Document Revised: 11/22/2014 Document Reviewed: 10/13/2012 Elsevier Interactive Patient Education 2016 [place] Diet for Gallbladder Conditions A low-fat diet can be helpful if you have pancreatitis or a gallbladder condition. With these conditions, your pancreas and gallbladder have trouble digesting fats. A healthy eating plan with less fat will help rest your pancreas and gallbladder and reduce your symptoms. WHAT DO I NEED TO KNOW ABOUT THIS DIET?  Eat a low-fat diet.  Reduce your fat intake to less than 20-30% of your total daily calories. This is less than 50-60 g of fat per day.  Remember that you need some fat in your diet. Ask your dietician what your daily goal should be.  Choose nonfat and low-fat healthy foods. Look for the words "nonfat," "low fat," or "fat free."  As a guide, look on the label and choose foods  with less than 3 g of fat per serving. Eat only one serving.  Avoid alcohol.  Do not smoke. If you need help quitting, talk with your health care provider.  Eat small frequent meals instead of three large heavy meals. WHAT FOODS CAN I EAT? Grains Include healthy grains and starches such as potatoes, wheat bread, fiber-rich cereal, and brown rice. Choose whole grain options whenever possible. In adults, whole grains should account for 45-65% of your daily calories.  Fruits and Vegetables Eat plenty of fruits and vegetables. Fresh fruits and vegetables add fiber to your diet. Meats and Other Protein Sources Eat lean meat such as chicken and pork. Trim any fat off of meat before cooking it. Eggs, fish, and beans are other sources of protein. In adults, these foods should account for 10-35% of your daily calories. Dairy Choose low-fat milk and dairy options. Dairy includes fat and protein, as well as calcium.  Fats and Oils Limit high-fat foods such as fried foods, sweets, baked goods, sugary drinks.  Other Creamy sauces and condiments, such as mayonnaise, can add extra fat. Think about whether or not you need to use them, or use smaller amounts or low fat options.  WHAT FOODS ARE NOT RECOMMENDED?  High fat foods, such as:  [employer].  Ice cream.  [place] toast.  Sweet rolls.  Pizza.  Cheese bread.  Foods covered with batter, butter, creamy sauces, or cheese.  Fried foods.  Sugary drinks and desserts.  Foods that cause gas or bloating   This information is not intended to replace advice given to you by your health care provider. Make sure you discuss any questions you have with your health care provider.   Document Released: 03/08/2013 Document Reviewed: 03/08/2013 Elsevier Interactive Patient Education [employer].

## 2017-09-10 ENCOUNTER — Telehealth: Payer: Self-pay | Admitting: Surgery

## 2017-09-10 NOTE — Telephone Encounter (Signed)
Pt advised of pre op date/time and sx date. Sx: 09/18/17 with Dr Maeola Sarah cholecystectomy.  Pre op: 09/11/17 between 1-5:00pm--phone interview.   Patient made aware to call [phone], between 1-3:00pm on 09/16/17, to find out what time to arrive.

## 2017-09-11 ENCOUNTER — Other Ambulatory Visit: Payer: Self-pay

## 2017-09-11 ENCOUNTER — Encounter
Admission: RE | Admit: 2017-09-11 | Discharge: 2017-09-11 | Disposition: A | Discharge status: Home / Self Care | Payer: BLUE CROSS/BLUE SHIELD | Source: Ambulatory Visit | Attending: Surgery | Admitting: Surgery

## 2017-09-11 HISTORY — DX: Anemia, unspecified: D64.9

## 2017-09-11 HISTORY — DX: Other complications of anesthesia, initial encounter: T88.59XA

## 2017-09-11 HISTORY — DX: Gastro-esophageal reflux disease without esophagitis: K21.9

## 2017-09-11 HISTORY — DX: Allergy status to unspecified drugs, medicaments and biological substances: Z88.9

## 2017-09-11 HISTORY — DX: Personal history of urinary calculi: Z87.442

## 2017-09-11 HISTORY — DX: Adverse effect of unspecified anesthetic, initial encounter: T41.45XA

## 2017-09-11 HISTORY — DX: Headache, unspecified: R51.9

## 2017-09-11 HISTORY — DX: Headache: R51

## 2017-09-11 HISTORY — DX: Anxiety disorder, unspecified: F41.9

## 2017-09-11 HISTORY — DX: Cyst of kidney, acquired: N28.1

## 2017-09-11 NOTE — Patient Instructions (Signed)
Your procedure is scheduled on: 09-18-17 Friday  Report to Same Day Surgery 2nd floor medical mall [facility] Entrance-take elevator on left to 2nd floor.  Check in with surgery information desk.) To find out your arrival time please call [phone] between 1PM - 3PM on 09-16-17 [facility]  Remember: Instructions that are not followed completely may result in serious medical risk, up to and including death, or upon the discretion of your surgeon and anesthesiologist your surgery may need to be rescheduled.    _x___ 1. Do not eat food after midnight the night before your procedure. NO GUM OR CANDY AFTER MIDNIGHT.  You may drink clear liquids up to 2 hours before you are scheduled to arrive at the hospital for your procedure.  Do not drink clear liquids within 2 hours of your scheduled arrival to the hospital.  Clear liquids include  --Water or Apple juice without pulp  --Clear carbohydrate beverage such as ClearFast or Gatorade  --Black Coffee or Clear Tea (No milk, no creamers, do not add anything to the coffee or Tea    __x__ 2. No Alcohol for 24 hours before or after surgery.   __x__3. No Smoking or e-cigarettes for 24 prior to surgery.  Do not use any chewable tobacco products for at least 6 hour prior to surgery   ____  4. Bring all medications with you on the day of surgery if instructed.    __x__ 5. Notify your doctor if there is any change in your medical condition     (cold, fever, infections).    x___6. On the morning of surgery brush your teeth with toothpaste and water.  You may rinse your mouth with mouth wash if you wish.  Do not swallow any toothpaste or mouthwash.   Do not wear jewelry, make-up, hairpins, clips or nail polish.  Do not wear lotions, powders, or perfumes. You may wear deodorant.  Do not shave 48 hours prior to surgery. Men may shave face and neck.  Do not bring valuables to the hospital.    [facility] is not responsible for any belongings or  valuables.               Contacts, dentures or bridgework may not be worn into surgery.  Leave your suitcase in the car. After surgery it may be brought to your room.  For patients admitted to the hospital, discharge time is determined by your treatment team.  _  Patients discharged the day of surgery will not be allowed to drive home.  You will need someone to drive you home and stay with you the night of your procedure.    Please read over the following fact sheets that you were given:   [facility] Preparing for Surgery   ____ Take anti-hypertensive listed below, cardiac, seizure, asthma, anti-reflux and psychiatric medicines. These include:  1. NONE  2.  3.  4.  5.  6.  ____Fleets enema or Magnesium Citrate as directed.   _x___ Use CHG Soap or sage wipes as directed on instruction sheet   ____ Use inhalers on the day of surgery and bring to hospital day of surgery  ____ Stop Metformin and Janumet 2 days prior to surgery.    ____ Take 1/2 of usual insulin dose the night before surgery and none on the morning surgery.   ____ Follow recommendations from Cardiologist, Pulmonologist or PCP regarding stopping Aspirin, Coumadin, Plavix ,Eliquis, Effient, or Pradaxa, and Pletal.  X____Stop Anti-inflammatories such as Advil,  Aleve, Ibuprofen, Motrin, Naproxen, Naprosyn, Goodies powders or aspirin products NOW-OK to take Tylenol OR HYDROCODONE IF NEEDED   ____ Stop supplements until after surgery.     ____ Bring C-Pap to the hospital.

## 2017-09-14 ENCOUNTER — Encounter
Admission: RE | Admit: 2017-09-14 | Discharge: 2017-09-14 | Disposition: A | Discharge status: Home / Self Care | Payer: BLUE CROSS/BLUE SHIELD | Source: Ambulatory Visit | Attending: Surgery | Admitting: Surgery

## 2017-09-14 DIAGNOSIS — Z01812 Encounter for preprocedural laboratory examination: Secondary | ICD-10-CM | POA: Diagnosis not present

## 2017-09-14 LAB — CBC WITH DIFFERENTIAL/PLATELET
BASOS ABS: 0.1 10*3/uL (ref 0–0.1)
Basophils Relative: 1 %
EOS ABS: 0.3 10*3/uL (ref 0–0.7)
Eosinophils Relative: 5 %
HCT: 37.4 % (ref 35.0–47.0)
HEMOGLOBIN: 12.8 g/dL (ref 12.0–16.0)
LYMPHS PCT: 32 %
Lymphs Abs: 2 10*3/uL (ref 1.0–3.6)
MCH: 30.6 pg (ref 26.0–34.0)
MCHC: 34.4 g/dL (ref 32.0–36.0)
MCV: 89 fL (ref 80.0–100.0)
Monocytes Absolute: 0.5 10*3/uL (ref 0.2–0.9)
Monocytes Relative: 8 %
NEUTROS PCT: 54 %
Neutro Abs: 3.3 10*3/uL (ref 1.4–6.5)
PLATELETS: 208 10*3/uL (ref 150–440)
RBC: 4.2 MIL/uL (ref 3.80–5.20)
RDW: 13 % (ref 11.5–14.5)
WBC: 6.2 10*3/uL (ref 3.6–11.0)

## 2017-09-14 LAB — COMPREHENSIVE METABOLIC PANEL
ALK PHOS: 59 U/L (ref 38–126)
ALT: 13 U/L (ref 0–44)
AST: 17 U/L (ref 15–41)
Albumin: 3.7 g/dL (ref 3.5–5.0)
Anion gap: 5 (ref 5–15)
BUN: 12 mg/dL (ref 6–20)
CALCIUM: 8.8 mg/dL — AB (ref 8.9–10.3)
CHLORIDE: 106 mmol/L (ref 98–111)
CO2: 28 mmol/L (ref 22–32)
Creatinine, Ser: 0.63 mg/dL (ref 0.44–1.00)
GFR calc Af Amer: >60 mL/min (ref >60)
GFR calc non Af Amer: >60 mL/min (ref >60)
GLUCOSE: 110 mg/dL — AB (ref 70–99)
Potassium: 3.8 mmol/L (ref 3.5–5.1)
SODIUM: 139 mmol/L (ref 135–145)
Total Bilirubin: 0.6 mg/dL (ref 0.3–1.2)
Total Protein: 6.9 g/dL (ref 6.5–8.1)

## 2017-09-17 MED ORDER — CEFAZOLIN SODIUM-DEXTROSE 2-4 GM/100ML-% IV SOLN
2.0000 g | INTRAVENOUS | Status: AC
  Administered 2017-09-18: 2 g via INTRAVENOUS

## 2017-09-18 ENCOUNTER — Ambulatory Visit
Admission: RE | Admit: 2017-09-18 | Discharge: 2017-09-18 | Disposition: A | Discharge status: Home / Self Care | Payer: BLUE CROSS/BLUE SHIELD | Source: Ambulatory Visit | Attending: Surgery | Admitting: Surgery

## 2017-09-18 ENCOUNTER — Ambulatory Visit: Payer: BLUE CROSS/BLUE SHIELD | Admitting: Certified Registered"

## 2017-09-18 ENCOUNTER — Encounter
Admission: RE | Disposition: A | Discharge status: Home / Self Care | Payer: Self-pay | Source: Ambulatory Visit | Attending: Surgery

## 2017-09-18 ENCOUNTER — Other Ambulatory Visit: Payer: Self-pay

## 2017-09-18 DIAGNOSIS — K8064 Calculus of gallbladder and bile duct with chronic cholecystitis without obstruction: Secondary | ICD-10-CM | POA: Diagnosis present

## 2017-09-18 DIAGNOSIS — K8020 Calculus of gallbladder without cholecystitis without obstruction: Secondary | ICD-10-CM

## 2017-09-18 HISTORY — PX: CHOLECYSTECTOMY: SHX55

## 2017-09-18 LAB — POCT PREGNANCY, URINE: Preg Test, Ur: NEGATIVE

## 2017-09-18 SURGERY — LAPAROSCOPIC CHOLECYSTECTOMY
Anesthesia: General | Wound class: Clean Contaminated

## 2017-09-18 MED ORDER — ONDANSETRON HCL 4 MG/2ML IJ SOLN
INTRAMUSCULAR | Status: AC
  Filled 2017-09-18: qty 2

## 2017-09-18 MED ORDER — FENTANYL CITRATE (PF) 100 MCG/2ML IJ SOLN
INTRAMUSCULAR | Status: AC
  Administered 2017-09-18: 25 ug via INTRAVENOUS
  Filled 2017-09-18: qty 2

## 2017-09-18 MED ORDER — MIDAZOLAM HCL 2 MG/2ML IJ SOLN
INTRAMUSCULAR | Status: AC
  Filled 2017-09-18: qty 2

## 2017-09-18 MED ORDER — FAMOTIDINE 20 MG PO TABS
20.0000 mg | ORAL_TABLET | Freq: Once | ORAL | Status: AC
  Administered 2017-09-18: 20 mg via ORAL

## 2017-09-18 MED ORDER — SUGAMMADEX SODIUM 200 MG/2ML IV SOLN
INTRAVENOUS | Status: DC | PRN
  Administered 2017-09-18: 175 mg via INTRAVENOUS

## 2017-09-18 MED ORDER — ROCURONIUM BROMIDE 100 MG/10ML IV SOLN
INTRAVENOUS | Status: DC | PRN
  Administered 2017-09-18: 40 mg via INTRAVENOUS

## 2017-09-18 MED ORDER — HYDROCODONE-ACETAMINOPHEN 5-325 MG PO TABS
1.0000 | ORAL_TABLET | Freq: Four times a day (QID) | ORAL | Status: DC | PRN
  Administered 2017-09-18: 1 via ORAL

## 2017-09-18 MED ORDER — LIDOCAINE HCL (PF) 2 % IJ SOLN
INTRAMUSCULAR | Status: AC
  Filled 2017-09-18: qty 10

## 2017-09-18 MED ORDER — DEXAMETHASONE SODIUM PHOSPHATE 10 MG/ML IJ SOLN
INTRAMUSCULAR | Status: DC | PRN
  Administered 2017-09-18: 8 mg via INTRAVENOUS

## 2017-09-18 MED ORDER — BUPIVACAINE-EPINEPHRINE (PF) 0.25% -1:200000 IJ SOLN
INTRAMUSCULAR | Status: DC | PRN
  Administered 2017-09-18: 30 mL via PERINEURAL

## 2017-09-18 MED ORDER — FAMOTIDINE 20 MG PO TABS
ORAL_TABLET | ORAL | Status: AC
  Administered 2017-09-18: 20 mg via ORAL
  Filled 2017-09-18: qty 1

## 2017-09-18 MED ORDER — FENTANYL CITRATE (PF) 100 MCG/2ML IJ SOLN
INTRAMUSCULAR | Status: AC
  Filled 2017-09-18: qty 2

## 2017-09-18 MED ORDER — HEPARIN SODIUM (PORCINE) 5000 UNIT/ML IJ SOLN
INTRAMUSCULAR | Status: AC
  Administered 2017-09-18: 5000 [IU] via SUBCUTANEOUS
  Filled 2017-09-18: qty 1

## 2017-09-18 MED ORDER — ROCURONIUM BROMIDE 50 MG/5ML IV SOLN
INTRAVENOUS | Status: AC
  Filled 2017-09-18: qty 1

## 2017-09-18 MED ORDER — HEPARIN SODIUM (PORCINE) 5000 UNIT/ML IJ SOLN
5000.0000 [IU] | Freq: Once | INTRAMUSCULAR | Status: AC
  Administered 2017-09-18: 5000 [IU] via SUBCUTANEOUS

## 2017-09-18 MED ORDER — DEXAMETHASONE SODIUM PHOSPHATE 10 MG/ML IJ SOLN
INTRAMUSCULAR | Status: AC
  Filled 2017-09-18: qty 1

## 2017-09-18 MED ORDER — ACETAMINOPHEN 10 MG/ML IV SOLN
INTRAVENOUS | Status: DC | PRN
  Administered 2017-09-18: 1000 mg via INTRAVENOUS

## 2017-09-18 MED ORDER — MIDAZOLAM HCL 2 MG/2ML IJ SOLN
INTRAMUSCULAR | Status: DC | PRN
  Administered 2017-09-18: 2 mg via INTRAVENOUS

## 2017-09-18 MED ORDER — HYDROCODONE-ACETAMINOPHEN 5-325 MG PO TABS: 1.0000 | ORAL_TABLET | Freq: Four times a day (QID) | ORAL | 0 refills | Status: DC | PRN

## 2017-09-18 MED ORDER — PROPOFOL 10 MG/ML IV BOLUS
INTRAVENOUS | Status: DC | PRN
  Administered 2017-09-18: 160 mg via INTRAVENOUS

## 2017-09-18 MED ORDER — FENTANYL CITRATE (PF) 100 MCG/2ML IJ SOLN
25.0000 ug | INTRAMUSCULAR | Status: DC | PRN
  Administered 2017-09-18 (×5): 25 ug via INTRAVENOUS

## 2017-09-18 MED ORDER — CEFAZOLIN SODIUM-DEXTROSE 2-4 GM/100ML-% IV SOLN
INTRAVENOUS | Status: AC
  Filled 2017-09-18: qty 100

## 2017-09-18 MED ORDER — SUGAMMADEX SODIUM 200 MG/2ML IV SOLN
INTRAVENOUS | Status: AC
  Filled 2017-09-18: qty 2

## 2017-09-18 MED ORDER — PROPOFOL 10 MG/ML IV BOLUS
INTRAVENOUS | Status: AC
  Filled 2017-09-18: qty 20

## 2017-09-18 MED ORDER — CHLORHEXIDINE GLUCONATE CLOTH 2 % EX PADS: 6.0000 | MEDICATED_PAD | Freq: Once | CUTANEOUS | Status: DC

## 2017-09-18 MED ORDER — LIDOCAINE HCL (CARDIAC) PF 100 MG/5ML IV SOSY
PREFILLED_SYRINGE | INTRAVENOUS | Status: DC | PRN
  Administered 2017-09-18: 100 mg via INTRAVENOUS

## 2017-09-18 MED ORDER — ONDANSETRON HCL 4 MG/2ML IJ SOLN: 4.0000 mg | Freq: Once | INTRAMUSCULAR | Status: DC | PRN

## 2017-09-18 MED ORDER — FENTANYL CITRATE (PF) 100 MCG/2ML IJ SOLN
INTRAMUSCULAR | Status: DC | PRN
  Administered 2017-09-18: 50 ug via INTRAVENOUS
  Administered 2017-09-18: 100 ug via INTRAVENOUS

## 2017-09-18 MED ORDER — ACETAMINOPHEN 10 MG/ML IV SOLN
INTRAVENOUS | Status: AC
  Filled 2017-09-18: qty 100

## 2017-09-18 MED ORDER — HYDROCODONE-ACETAMINOPHEN 5-325 MG PO TABS
ORAL_TABLET | ORAL | Status: AC
  Filled 2017-09-18: qty 1

## 2017-09-18 MED ORDER — ONDANSETRON HCL 4 MG/2ML IJ SOLN
INTRAMUSCULAR | Status: DC | PRN
  Administered 2017-09-18: 4 mg via INTRAVENOUS

## 2017-09-18 MED ORDER — BUPIVACAINE-EPINEPHRINE (PF) 0.25% -1:200000 IJ SOLN
INTRAMUSCULAR | Status: AC
  Filled 2017-09-18: qty 30

## 2017-09-18 MED ORDER — LACTATED RINGERS IV SOLN
INTRAVENOUS | Status: DC
  Administered 2017-09-18: 10:00:00 via INTRAVENOUS

## 2017-09-18 SURGICAL SUPPLY — 43 items
ADHESIVE MASTISOL STRL (MISCELLANEOUS) ×3 IMPLANT
APPLIER CLIP ROT 10 11.4 M/L (STAPLE) ×3
BLADE SURG SZ11 CARB STEEL (BLADE) ×3 IMPLANT
CANISTER SUCT 1200ML W/VALVE (MISCELLANEOUS) ×3 IMPLANT
CATH CHOLANGI 4FR 420404F (CATHETERS) IMPLANT
CHLORAPREP W/TINT 26ML (MISCELLANEOUS) ×3 IMPLANT
CLIP APPLIE ROT 10 11.4 M/L (STAPLE) ×1 IMPLANT
CLOSURE WOUND 1/2 X4 (GAUZE/BANDAGES/DRESSINGS) ×1
CONRAY 60ML FOR OR (MISCELLANEOUS) IMPLANT
DRAPE C-ARM XRAY 36X54 (DRAPES) IMPLANT
ELECT REM PT RETURN 9FT ADLT (ELECTROSURGICAL) ×3
ELECTRODE REM PT RTRN 9FT ADLT (ELECTROSURGICAL) ×1 IMPLANT
GLOVE BIO SURGEON STRL SZ8 (GLOVE) ×3 IMPLANT
GOWN STRL REUS W/ TWL LRG LVL3 (GOWN DISPOSABLE) ×4 IMPLANT
GOWN STRL REUS W/TWL LRG LVL3 (GOWN DISPOSABLE) ×8
IRRIGATION STRYKERFLOW (MISCELLANEOUS) ×1 IMPLANT
IRRIGATOR STRYKERFLOW (MISCELLANEOUS) ×3
IV CATH ANGIO 12GX3 LT BLUE (NEEDLE) ×3 IMPLANT
IV NS 1000ML (IV SOLUTION) ×2
IV NS 1000ML BAXH (IV SOLUTION) ×1 IMPLANT
JACKSON PRATT 10 (INSTRUMENTS) IMPLANT
KIT TURNOVER KIT A (KITS) ×3 IMPLANT
LABEL OR SOLS (LABEL) ×3 IMPLANT
NEEDLE HYPO 22GX1.5 SAFETY (NEEDLE) ×3 IMPLANT
NEEDLE VERESS 14GA 120MM (NEEDLE) ×3 IMPLANT
NS IRRIG 500ML POUR BTL (IV SOLUTION) ×3 IMPLANT
PACK LAP CHOLECYSTECTOMY (MISCELLANEOUS) ×3 IMPLANT
POUCH SPECIMEN RETRIEVAL 10MM (ENDOMECHANICALS) ×3 IMPLANT
SCISSORS METZENBAUM CVD 33 (INSTRUMENTS) ×3 IMPLANT
SLEEVE ENDOPATH XCEL 5M (ENDOMECHANICALS) ×6 IMPLANT
SPONGE GAUZE 2X2 8PLY STER LF (GAUZE/BANDAGES/DRESSINGS) ×4
SPONGE GAUZE 2X2 8PLY STRL LF (GAUZE/BANDAGES/DRESSINGS) ×8 IMPLANT
SPONGE LAP 18X18 RF (DISPOSABLE) ×3 IMPLANT
SPONGE VERSALON 4X4 4PLY (MISCELLANEOUS) IMPLANT
STRIP CLOSURE SKIN 1/2X4 (GAUZE/BANDAGES/DRESSINGS) ×2 IMPLANT
SUT MNCRL 4-0 (SUTURE) ×2
SUT MNCRL 4-0 27XMFL (SUTURE) ×1
SUT VICRYL 0 AB UR-6 (SUTURE) ×3 IMPLANT
SUTURE MNCRL 4-0 27XMF (SUTURE) ×1 IMPLANT
SYR 20CC LL (SYRINGE) ×3 IMPLANT
TROCAR XCEL NON-BLD 11X100MML (ENDOMECHANICALS) ×3 IMPLANT
TROCAR XCEL NON-BLD 5MMX100MML (ENDOMECHANICALS) ×3 IMPLANT
TUBING INSUFFLATION (TUBING) ×3 IMPLANT

## 2017-09-18 NOTE — Progress Notes (Signed)
Preoperative Review   Patient is met in the preoperative holding area. The history is reviewed in the chart and with the patient. I personally reviewed the options and rationale as well as the risks of this procedure that have been previously discussed with the patient. All questions asked by the patient and/or family were answered to their satisfaction.  Patient agrees to proceed with this procedure at this time.  Michelle Hebert E Yeni Jiggetts M.D. FACS  

## 2017-09-18 NOTE — Anesthesia Preprocedure Evaluation (Signed)
Anesthesia Evaluation  Patient identified by MRN, date of birth, ID band Patient awake    Reviewed: Allergy & Precautions, NPO status , Patient's Chart, lab work & pertinent test results  History of Anesthesia Complications (+) PROLONGED EMERGENCE and history of anesthetic complications  Airway Mallampati: II       Dental   Pulmonary neg sleep apnea, neg COPD,           Cardiovascular (-) hypertension(-) Past MI and (-) CHF (-) dysrhythmias (-) Valvular Problems/Murmurs     Neuro/Psych neg Seizures Anxiety    GI/Hepatic Neg liver ROS, GERD  ,  Endo/Other  neg diabetes  Renal/GU Renal disease (stones, cyst)     Musculoskeletal   Abdominal   Peds  Hematology  (+) anemia ,   Anesthesia Other Findings   Reproductive/Obstetrics                             Anesthesia Physical Anesthesia Plan  ASA: II  Anesthesia Plan: General   Post-op Pain Management:    Induction: Intravenous  PONV Risk Score and Plan: 3 and Dexamethasone, Ondansetron and Midazolam  Airway Management Planned: Oral ETT  Additional Equipment:   Intra-op Plan:   Post-operative Plan:   Informed Consent: I have reviewed the patients History and Physical, chart, labs and discussed the procedure including the risks, benefits and alternatives for the proposed anesthesia with the patient or authorized representative who has indicated his/her understanding and acceptance.     Plan Discussed with:   Anesthesia Plan Comments:         Anesthesia Quick Evaluation

## 2017-09-18 NOTE — Anesthesia Procedure Notes (Signed)

## 2017-09-18 NOTE — Anesthesia Post-op Follow-up Note (Signed)
Anesthesia QCDR form completed.        

## 2017-09-18 NOTE — Discharge Instructions (Signed)
Remove dressing in 24 hours. °May shower in 24 hours. °Leave paper strips in place. °Resume all home medications. °Follow-up with Dr. Cooper in 10 days. ° °AMBULATORY SURGERY  °DISCHARGE INSTRUCTIONS ° ° °1) The drugs that you were given will stay in your system until tomorrow so for the next 24 hours you should not: ° °A) Drive an automobile °B) Make any legal decisions °C) Drink any alcoholic beverage ° ° °2) You may resume regular meals tomorrow.  Today it is better to start with liquids and gradually work up to solid foods. ° °You may eat anything you prefer, but it is better to start with liquids, then soup and crackers, and gradually work up to solid foods. ° ° °3) Please notify your doctor immediately if you have any unusual bleeding, trouble breathing, redness and pain at the surgery site, drainage, fever, or pain not relieved by medication. ° ° ° °4) Additional Instructions: ° ° ° ° ° ° ° °Please contact your physician with any problems or Same Day Surgery at [phone], Monday through Friday 6 am to 4 pm, or [place] [facility] Main number at [phone]. °

## 2017-09-18 NOTE — Transfer of Care (Signed)
Immediate Anesthesia Transfer of Care Note  Patient: Michelle Hebert  Procedure(s) Performed: LAPAROSCOPIC CHOLECYSTECTOMY (N/A )  Patient Location: PACU  Anesthesia Type:General  Level of Consciousness: sedated and responds to stimulation  Airway & Oxygen Therapy: Patient Spontanous Breathing and Patient connected to face mask oxygen  Post-op Assessment: Report given to RN and Post -op Vital signs reviewed and stable  Post vital signs: Reviewed and stable  Last Vitals:  Vitals Value Taken Time  BP 120/82 09/18/2017 10:49 AM  Temp 36.2 C 09/18/2017 10:48 AM  Pulse 84 09/18/2017 10:49 AM  Resp 16 09/18/2017 10:49 AM  SpO2 100 % 09/18/2017 10:49 AM    Last Pain:  Vitals:   09/18/17 0927  TempSrc: Oral  PainSc: 0-No pain         Complications: No apparent anesthesia complications

## 2017-09-18 NOTE — Op Note (Signed)
Laparoscopic Cholecystectomy  Pre-operative Diagnosis: biliary colic  Post-operative Diagnosis: same  Procedure: laparascopic cholecystectomy  Surgeon: Jerrol Banana. Burt Knack, MD FACS  Anesthesia: Gen. with endotracheal tube  Assistant:RNFA  Procedure Details  The patient was seen again in the Holding Room. The benefits, complications, treatment options, and expected outcomes were discussed with the patient. The risks of bleeding, infection, recurrence of symptoms, failure to resolve symptoms, bile duct damage, bile duct leak, retained common bile duct stone, bowel injury, any of which could require further surgery and/or ERCP, stent, or papillotomy were reviewed with the patient. The likelihood of improving the patient's symptoms with return to their baseline status is good.  The patient and/or family concurred with the proposed plan, giving informed consent.  The patient was taken to Operating Room, identified as Michelle Hebert and the procedure verified as Laparoscopic Cholecystectomy.  A Time Out was held and the above information confirmed.  Prior to the induction of general anesthesia, antibiotic prophylaxis was administered. VTE prophylaxis was in place. General endotracheal anesthesia was then administered and tolerated well. After the induction, the abdomen was prepped with Chloraprep and draped in the sterile fashion. The patient was positioned in the supine position.  Local anesthetic  was injected into the skin near the umbilicus and an incision made. The Veress needle was placed. Pneumoperitoneum was then created with CO2 and tolerated well without any adverse changes in the patient's vital signs. A 96mm port was placed in the periumbilical position and the abdominal cavity was explored.  Two 5-mm ports were placed in the right upper quadrant and a 12 mm epigastric port was placed all under direct vision. All skin incisions  were infiltrated with a local anesthetic agent before making the  incision and placing the trocars.   The patient was positioned  in reverse Trendelenburg, tilted slightly to the patient's left.  The gallbladder was identified, the fundus grasped and retracted cephalad. Adhesions were lysed bluntly. The infundibulum was grasped and retracted laterally, exposing the peritoneum overlying the triangle of Calot. This was then divided and exposed in a blunt fashion. A critical view of the cystic duct and cystic artery was obtained.  The cystic duct was clearly identified and bluntly dissected.   Cystic lymphatics and cystic artery were doubly clipped and divided. Cystic duct was then doubly clipped and divided.  The gallbladder was taken from the gallbladder fossa in a retrograde fashion with the electrocautery. The gallbladder was removed and placed in an Endocatch bag. The liver bed was irrigated and inspected. Hemostasis was achieved with the electrocautery. Copious irrigation was utilized and was repeatedly aspirated until clear.  The gallbladder and Endocatch sac were then removed through the epigastric port site.   An arterial blleeder was noted in the GB fossa that was doubly clipped.  Inspection of the right upper quadrant was performed. No bleeding, bile duct injury or leak, or bowel injury was noted. Pneumoperitoneum was released.  The epigastric port site was closed with figure-of-eight 0 Vicryl sutures. 4-0 subcuticular Monocryl was used to close the skin. Steristrips and Mastisol and sterile dressings were  applied.  The patient was then extubated and brought to the recovery room in stable condition. Sponge, lap, and needle counts were correct at closure and at the conclusion of the case.   Findings: chronic Cholecystitis   Estimated Blood Loss: 25 cc         Drains: none         Specimens: Gallbladder  Complications: none               Michelle Tapscott E. Burt Knack, MD, FACS

## 2017-09-18 NOTE — Anesthesia Postprocedure Evaluation (Signed)
Anesthesia Post Note  Patient: Michelle Hebert  Procedure(s) Performed: LAPAROSCOPIC CHOLECYSTECTOMY (N/A )  Patient location during evaluation: PACU Anesthesia Type: General Level of consciousness: awake and alert Pain management: pain level controlled Vital Signs Assessment: post-procedure vital signs reviewed and stable Respiratory status: spontaneous breathing and respiratory function stable Cardiovascular status: stable Anesthetic complications: no     Last Vitals:  Vitals:   09/18/17 1127 09/18/17 1138  BP: 118/74 112/71  Pulse: 63 60  Resp: 15 16  Temp: 36.4 C (!) 36.3 C  SpO2: 100% 98%    Last Pain:  Vitals:   09/18/17 1138  TempSrc: Temporal  PainSc:                  KEPHART,WILLIAM K

## 2017-09-21 LAB — SURGICAL PATHOLOGY

## 2017-09-28 ENCOUNTER — Telehealth: Payer: Self-pay

## 2017-09-28 ENCOUNTER — Other Ambulatory Visit
Admission: RE | Admit: 2017-09-28 | Discharge: 2017-09-28 | Disposition: A | Discharge status: Home / Self Care | Payer: BLUE CROSS/BLUE SHIELD | Source: Ambulatory Visit | Attending: Surgery | Admitting: Surgery

## 2017-09-28 ENCOUNTER — Ambulatory Visit (INDEPENDENT_AMBULATORY_CARE_PROVIDER_SITE_OTHER): Payer: BLUE CROSS/BLUE SHIELD | Admitting: Surgery

## 2017-09-28 ENCOUNTER — Encounter: Payer: Self-pay | Admitting: Surgery

## 2017-09-28 VITALS — BP 138/80 | HR 94 | Temp 98.7°F | Wt 192.0 lb

## 2017-09-28 DIAGNOSIS — K8020 Calculus of gallbladder without cholecystitis without obstruction: Secondary | ICD-10-CM

## 2017-09-28 LAB — COMPREHENSIVE METABOLIC PANEL
ALBUMIN: 3.7 g/dL (ref 3.5–5.0)
ALT: 18 U/L (ref 0–44)
ANION GAP: 7 (ref 5–15)
AST: 16 U/L (ref 15–41)
Alkaline Phosphatase: 65 U/L (ref 38–126)
BUN: 9 mg/dL (ref 6–20)
CALCIUM: 8.6 mg/dL — AB (ref 8.9–10.3)
CO2: 26 mmol/L (ref 22–32)
Chloride: 106 mmol/L (ref 98–111)
Creatinine, Ser: 0.77 mg/dL (ref 0.44–1.00)
GFR calc Af Amer: >60 mL/min (ref >60)
GFR calc non Af Amer: >60 mL/min (ref >60)
GLUCOSE: 93 mg/dL (ref 70–99)
Potassium: 4.1 mmol/L (ref 3.5–5.1)
SODIUM: 139 mmol/L (ref 135–145)
Total Bilirubin: 0.6 mg/dL (ref 0.3–1.2)
Total Protein: 6.9 g/dL (ref 6.5–8.1)

## 2017-09-28 LAB — CBC
HEMATOCRIT: 36.4 % (ref 35.0–47.0)
Hemoglobin: 12.6 g/dL (ref 12.0–16.0)
MCH: 30.9 pg (ref 26.0–34.0)
MCHC: 34.5 g/dL (ref 32.0–36.0)
MCV: 89.5 fL (ref 80.0–100.0)
Platelets: 204 10*3/uL (ref 150–440)
RBC: 4.06 MIL/uL (ref 3.80–5.20)
RDW: 13.5 % (ref 11.5–14.5)
WBC: 7 10*3/uL (ref 3.6–11.0)

## 2017-09-28 LAB — LIPASE, BLOOD: Lipase: 27 U/L (ref 11–51)

## 2017-09-28 MED ORDER — ONDANSETRON HCL 4 MG PO TABS: 4.0000 mg | ORAL_TABLET | Freq: Three times a day (TID) | ORAL | 0 refills | Status: AC | PRN

## 2017-09-28 NOTE — Progress Notes (Signed)
Outpatient postop visit  09/28/2017  Michelle Hebert is an 47 y.o. female.    Procedure: Laparoscopic cholecystectomy  CC: Nausea and poor appetite  HPI: This patient status post laparoscopic cholecystectomy.  This patient describes nausea and poor appetite.  She feels bloated she has some mild right upper quadrant pain but nothing like her prior attacks.  He denies fevers or chills and has not vomited.  She is forcing herself to eat but it makes her feel uncomfortable.  Medications reviewed.    Physical Exam:  There were no vitals taken for this visit.    PE:  Vital signs reviewed and stable afebrile no icterus no jaundice abdomen soft nontender wounds are clean no erythema or drainage.  Guarding rebound or percussion tenderness  Assessment/Plan:  This patient status post lap scopic cholecystectomy.  Her pathology was reviewed confirming the diagnosis and need for surgery with no other signs of malignancy etc. Patient has some mild right upper quadrant pain and some nausea.  Her appetite is poor at this time.  I would like to get some liver function tests and a CBC and we will call her with those results.  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  We will call her with the results.  We will also phone in some Zofran for her nausea.  Florene Glen, MD, FACS

## 2017-09-28 NOTE — Telephone Encounter (Signed)
Called patient back to let her know that her labs were normal. But if she continued to have the same symptoms as today. Then she is to give [place] a call on Friday 10/02/2017. However, patient requested if she was okay to go back to work until next week on Monday 10/05/2017. I told her that it was fine. I told her that the letter will be left at the front desk per her request. Dr. Burt Knack had agreed on the disability certificate date. Patient had no further questions.

## 2017-09-28 NOTE — Patient Instructions (Signed)
Please go to the [facility] and have your blood drawn. We will call you with results.  Please got o your local pharmacy and pick up OTC Pepcid or Omeprazole to see if this helps you with your acid reflux and/or burping.  Please call us back in two days and let us know how you are feeling and depending on your labs as well, we will let you know when to return to work.   GENERAL POST-OPERATIVE PATIENT INSTRUCTIONS   WOUND CARE INSTRUCTIONS:  Keep a dry clean dressing on the wound if there is drainage. The initial bandage may be removed after 24 hours.  Once the wound has quit draining you may leave it open to air.  If clothing rubs against the wound or causes irritation and the wound is not draining you may cover it with a dry dressing during the daytime.  Try to keep the wound dry and avoid ointments on the wound unless directed to do so.  If the wound becomes bright red and painful or starts to drain infected material that is not clear, please contact your physician immediately.  If the wound is mildly pink and has a thick firm ridge underneath it, this is normal, and is referred to as a healing ridge.  This will resolve over the next 4-6 weeks.  BATHING: You may shower if you have been informed of this by your surgeon. However, Please do not submerge in a tub, hot tub, or pool until incisions are completely sealed or have been told by your surgeon that you may do so.  DIET:  You may eat any foods that you can tolerate.  It is a good idea to eat a high fiber diet and take in plenty of fluids to prevent constipation.  If you do become constipated you may want to take a mild laxative or take ducolax tablets on a daily basis until your bowel habits are regular.  Constipation can be very uncomfortable, along with straining, after recent surgery.  ACTIVITY:  You are encouraged to cough and deep breath or use your incentive spirometer if you were given one, every 15-30 minutes when awake.  This will  help prevent respiratory complications and low grade fevers post-operatively if you had a general anesthetic.  You may want to hug a pillow when coughing and sneezing to add additional support to the surgical area, if you had abdominal or chest surgery, which will decrease pain during these times.  You are encouraged to walk and engage in light activity for the next two weeks.  You should not lift more than 20 pounds, until 10/30/2017 as it could put you at increased risk for complications.  Twenty pounds is roughly equivalent to a plastic bag of groceries. At that time- Listen to your body when lifting, if you have pain when lifting, stop and then try again in a few days. Soreness after doing exercises or activities of daily living is normal as you get back in to your normal routine.  MEDICATIONS:  Try to take narcotic medications and anti-inflammatory medications, such as tylenol, ibuprofen, naprosyn, etc., with food.  This will minimize stomach upset from the medication.  Should you develop nausea and vomiting from the pain medication, or develop a rash, please discontinue the medication and contact your physician.  You should not drive, make important decisions, or operate machinery when taking narcotic pain medication.  SUNBLOCK Use sun block to incision area over the next year if this area will be  exposed to sun. This helps decrease scarring and will allow you avoid a permanent darkened area over your incision.  QUESTIONS:  Please feel free to call our office if you have any questions, and we will be glad to assist you. [phone]

## 2017-10-12 ENCOUNTER — Telehealth: Payer: Self-pay | Admitting: Surgery

## 2017-10-12 NOTE — Telephone Encounter (Signed)
Patient came by and dropped off fmla paperwork paperwork has been placed in folder and payment has been made. Patient would like to pick paperwork up,

## 2017-10-14 NOTE — Telephone Encounter (Signed)
FMLA paperwork completed. Patient notified to pick up due to not wanting it faxed. Copy made and placed in folder.

## 2018-10-05 ENCOUNTER — Emergency Department: Payer: Self-pay

## 2018-10-05 ENCOUNTER — Emergency Department
Admission: EM | Admit: 2018-10-05 | Discharge: 2018-10-05 | Disposition: A | Discharge status: Home / Self Care | Payer: Self-pay | Attending: Student in an Organized Health Care Education/Training Program | Admitting: Student in an Organized Health Care Education/Training Program

## 2018-10-05 ENCOUNTER — Other Ambulatory Visit: Payer: Self-pay

## 2018-10-05 ENCOUNTER — Encounter: Payer: Self-pay | Admitting: Emergency Medicine

## 2018-10-05 DIAGNOSIS — N938 Other specified abnormal uterine and vaginal bleeding: Secondary | ICD-10-CM | POA: Insufficient documentation

## 2018-10-05 DIAGNOSIS — D259 Leiomyoma of uterus, unspecified: Secondary | ICD-10-CM | POA: Insufficient documentation

## 2018-10-05 DIAGNOSIS — N939 Abnormal uterine and vaginal bleeding, unspecified: Secondary | ICD-10-CM

## 2018-10-05 LAB — CBC WITH DIFFERENTIAL/PLATELET
Abs Immature Granulocytes: 0.01 10*3/uL (ref 0.00–0.07)
Basophils Absolute: 0 10*3/uL (ref 0.0–0.1)
Basophils Relative: 1 %
Eosinophils Absolute: 0.2 10*3/uL (ref 0.0–0.5)
Eosinophils Relative: 4 %
HCT: 36.1 % (ref 36.0–46.0)
Hemoglobin: 12.2 g/dL (ref 12.0–15.0)
Immature Granulocytes: 0 %
Lymphocytes Relative: 43 %
Lymphs Abs: 2.4 10*3/uL (ref 0.7–4.0)
MCH: 30.2 pg (ref 26.0–34.0)
MCHC: 33.8 g/dL (ref 30.0–36.0)
MCV: 89.4 fL (ref 80.0–100.0)
Monocytes Absolute: 0.4 10*3/uL (ref 0.1–1.0)
Monocytes Relative: 7 %
Neutro Abs: 2.6 10*3/uL (ref 1.7–7.7)
Neutrophils Relative %: 45 %
Platelets: 206 10*3/uL (ref 150–400)
RBC: 4.04 MIL/uL (ref 3.87–5.11)
RDW: 12 % (ref 11.5–15.5)
WBC: 5.6 10*3/uL (ref 4.0–10.5)
nRBC: 0 % (ref 0.0–0.2)

## 2018-10-05 LAB — TYPE AND SCREEN
ABO/RH(D): A POS
Antibody Screen: NEGATIVE

## 2018-10-05 LAB — POCT PREGNANCY, URINE: Preg Test, Ur: NEGATIVE

## 2018-10-05 MED ORDER — TRANEXAMIC ACID 650 MG PO TABS
1300.0000 mg | ORAL_TABLET | Freq: Three times a day (TID) | ORAL | Status: DC
  Administered 2018-10-05: 17:00:00 1300 mg via ORAL
  Filled 2018-10-05 (×2): qty 2

## 2018-10-05 MED ORDER — TRANEXAMIC ACID 650 MG PO TABS: 1300.0000 mg | ORAL_TABLET | Freq: Three times a day (TID) | ORAL | 0 refills | Status: AC

## 2018-10-05 NOTE — ED Provider Notes (Signed)
[facility] Emergency Department Provider Note    First MD Initiated Contact with Patient 10/05/18 1229     (approximate)  I have reviewed the triage vital signs and the nursing notes.   HISTORY  Chief Complaint Vaginal Bleeding    HPI Michelle Hebert is a 48 y.o. female previously healthy status post tubal ligation several years ago not any birth control and does not smoke presents the ER for 4 days of heavy vaginal bleeding which she is states that she is passing clots and soaking through multiple tampons and pads daily.  Denies any fevers.  No pain.  Feels like a heavy period.  Did miss her last cycle this past month.  Was typically regular.  Denies any chance of being pregnant.  No nausea or vomiting.  She is on any blood thinners.  Does not have an OB/GYN.    Past Medical History:  Diagnosis Date  . Anemia   . Anxiety    no meds  . Complication of anesthesia    SHAKING UNCONTROLLABLY  . Cyst of kidney, acquired   . GERD (gastroesophageal reflux disease)    oc-tums  . H/O seasonal allergies   . Headache    migraines often-hormonal  . History of kidney stones    h/o   Family History  Problem Relation Age of Onset  . Diabetes Mother   . Diverticulitis Mother   . Multiple sclerosis Mother   . Dystonia Mother   . Diabetes Maternal Grandmother   . Heart disease Maternal Grandmother   . Stroke Maternal Grandmother    Past Surgical History:  Procedure Laterality Date  . CHOLECYSTECTOMY N/A 09/18/2017   Procedure: LAPAROSCOPIC CHOLECYSTECTOMY;  Surgeon: Florene Glen, MD;  Location: [facility] ORS;  Service: General;  Laterality: N/A;  . TUBAL LIGATION    . TUBAL LIGATION     Patient Active Problem List   Diagnosis Date Noted  . Calculus of gallbladder without cholecystitis without obstruction       Prior to Admission medications   Medication Sig Start Date End Date Taking? Authorizing Provider  fexofenadine (ALLEGRA) 180 MG tablet Take  180 mg by mouth daily as needed for allergies or rhinitis.    [provider]  fluticasone (FLONASE) 50 MCG/ACT nasal spray Place 2 sprays into both nostrils daily as needed for allergies.  06/15/17   [provider]  naproxen sodium (ALEVE) 220 MG tablet Take 220 mg by mouth 3 (three) times daily as needed (pain).    [provider]  ondansetron (ZOFRAN) 4 MG tablet Take 1 tablet (4 mg total) by mouth every 8 (eight) hours as needed for nausea or vomiting. 09/28/17   Florene Glen, MD  tranexamic acid (LYSTEDA) 650 MG TABS tablet Take 2 tablets (1,300 mg total) by mouth 3 (three) times daily for 4 days. Until bleeding decreases or a maximum of four days 10/05/18 10/09/18  Merlyn Lot, MD    Allergies Patient has no known allergies.    Social History Social History   Tobacco Use  . Smoking status: Never Smoker  . Smokeless tobacco: Never Used  Substance Use Topics  . Alcohol use: Yes    Comment: WINE OCC  . Drug use: Never    Review of Systems Patient denies headaches, rhinorrhea, blurry vision, numbness, shortness of breath, chest pain, edema, cough, abdominal pain, nausea, vomiting, diarrhea, dysuria, fevers, rashes or hallucinations unless otherwise stated above in HPI. ____________________________________________   PHYSICAL EXAM:  VITAL  SIGNS: Vitals:   10/05/18 1300 10/05/18 1315  BP:  127/84  Pulse: 71 64  Temp:    SpO2: 99% 97%    Constitutional: Alert and oriented.  Eyes: Conjunctivae are normal.  Head: Atraumatic. Nose: No congestion/rhinnorhea. Mouth/Throat: Mucous membranes are moist.   Neck: No stridor. Painless ROM.  Cardiovascular: Normal rate, regular rhythm. Grossly normal heart sounds.  Good peripheral circulation. Respiratory: Normal respiratory effort.  No retractions. Lungs CTAB. Gastrointestinal: Soft and nontender. No distention. No abdominal bruits. No CVA tenderness. Genitourinary: declined by patient  Musculoskeletal: No lower extremity tenderness nor edema.  No joint effusions. Neurologic:  Normal speech and language. No gross focal neurologic deficits are appreciated. No facial droop Skin:  Skin is warm, dry and intact. No rash noted. Psychiatric: Mood and affect are normal. Speech and behavior are normal.  ____________________________________________   LABS (all labs ordered are listed, but only abnormal results are displayed)  Results for orders placed or performed during the hospital encounter of 10/05/18 (from the past 24 hour(s))  Pregnancy, urine POC     Status: None   Collection Time: 10/05/18 11:14 AM  Result Value Ref Range   Preg Test, Ur NEGATIVE NEGATIVE  CBC with Differential/Platelet     Status: None   Collection Time: 10/05/18 12:49 PM  Result Value Ref Range   WBC 5.6 4.0 - 10.5 K/uL   RBC 4.04 3.87 - 5.11 MIL/uL   Hemoglobin 12.2 12.0 - 15.0 g/dL   HCT 36.1 36.0 - 46.0 %   MCV 89.4 80.0 - 100.0 fL   MCH 30.2 26.0 - 34.0 pg   MCHC 33.8 30.0 - 36.0 g/dL   RDW 12.0 11.5 - 15.5 %   Platelets 206 150 - 400 K/uL   nRBC 0.0 0.0 - 0.2 %   Neutrophils Relative % 45 %   Neutro Abs 2.6 1.7 - 7.7 K/uL   Lymphocytes Relative 43 %   Lymphs Abs 2.4 0.7 - 4.0 K/uL   Monocytes Relative 7 %   Monocytes Absolute 0.4 0.1 - 1.0 K/uL   Eosinophils Relative 4 %   Eosinophils Absolute 0.2 0.0 - 0.5 K/uL   Basophils Relative 1 %   Basophils Absolute 0.0 0.0 - 0.1 K/uL   Immature Granulocytes 0 %   Abs Immature Granulocytes 0.01 0.00 - 0.07 K/uL  Type and screen [facility]     Status: None   Collection Time: 10/05/18 12:49 PM  Result Value Ref Range   ABO/RH(D) A POS    Antibody Screen NEG    Sample Expiration      10/08/2018,2359 Performed at [facility], [address] [place], [place] [address]    ____________________________________________ ____________________________________________  RADIOLOGY  I personally reviewed all  radiographic images ordered to evaluate for the above acute complaints and reviewed radiology reports and findings.  These findings were personally discussed with the patient.  Please see medical record for radiology report.  ____________________________________________   PROCEDURES  Procedure(s) performed:  Procedures    Critical Care performed: no ____________________________________________   INITIAL IMPRESSION / ASSESSMENT AND PLAN / ED COURSE  Pertinent labs & imaging results that were available during my care of the patient were reviewed by me and considered in my medical decision making (see chart for details).   DDX: aub, dus, mass, ectopic, miscarriage  Lucille Witts is a 48 y.o. who presents to the ED with vaginal bleeding as described above.  She is nontoxic afebrile and hemodynamically stable.  Her abdominal exam is soft and benign.  Hemoglobin is stable.  Not on any blood thinners.  Ultrasound does show evidence of uterine fibroids.  She is not pregnant.  Recommended pelvic exam to further evaluate but patient has declined this.  Given her presenting symptoms will start on Lysteda after discussing alternative options including observation, initiating oral contraceptive medication, referral.  She agrees to follow-up with OB/GYN.  Have discussed with the patient and available family all diagnostics and treatments performed thus far and all questions were answered to the best of my ability. The patient demonstrates understanding and agreement with plan.      The patient was evaluated in Emergency Department today for the symptoms described in the history of present illness. He/she was evaluated in the context of the global COVID-19 pandemic, which necessitated consideration that the patient might be at risk for infection with the SARS-CoV-2 virus that causes COVID-19. Institutional protocols and algorithms that pertain to the evaluation of patients at risk for COVID-19 are in a  state of rapid change based on information released by regulatory bodies including the CDC and federal and state organizations. These policies and algorithms were followed during the patient's care in the ED.  As part of my medical decision making, I reviewed the following data within the [facility] notes reviewed and incorporated, Labs reviewed, notes from prior ED visits and [place] Controlled Substance Database   ____________________________________________   FINAL CLINICAL IMPRESSION(S) / ED DIAGNOSES  Final diagnoses:  Episode of heavy vaginal bleeding  Uterine leiomyoma, unspecified location      NEW MEDICATIONS STARTED DURING THIS VISIT:  New Prescriptions   TRANEXAMIC ACID (LYSTEDA) 650 MG TABS TABLET    Take 2 tablets (1,300 mg total) by mouth 3 (three) times daily for 4 days. Until bleeding decreases or a maximum of four days     Note:  This document was prepared using Dragon voice recognition software and may include unintentional dictation errors.    Merlyn Lot, MD 10/05/18 1600

## 2018-10-05 NOTE — ED Notes (Signed)
Pt stated she wasn't in pain at this time however she experiences abdominal cramping intermittenly

## 2018-10-05 NOTE — ED Triage Notes (Signed)
Heavy vaginal bleeding for 4 days with clots.

## 2021-04-02 DIAGNOSIS — G43909 Migraine, unspecified, not intractable, without status migrainosus: Secondary | ICD-10-CM | POA: Diagnosis not present

## 2021-04-03 ENCOUNTER — Other Ambulatory Visit: Payer: Self-pay

## 2021-04-03 ENCOUNTER — Emergency Department
Admission: EM | Admit: 2021-04-03 | Discharge: 2021-04-03 | Disposition: A | Discharge status: Home / Self Care | Payer: 59 | Attending: Emergency Medicine | Admitting: Emergency Medicine

## 2021-04-03 ENCOUNTER — Encounter: Payer: Self-pay | Admitting: Emergency Medicine

## 2021-04-03 ENCOUNTER — Emergency Department: Payer: 59

## 2021-04-03 DIAGNOSIS — R519 Headache, unspecified: Secondary | ICD-10-CM | POA: Diagnosis not present

## 2021-04-03 DIAGNOSIS — Z8679 Personal history of other diseases of the circulatory system: Secondary | ICD-10-CM | POA: Diagnosis not present

## 2021-04-03 DIAGNOSIS — Z87828 Personal history of other (healed) physical injury and trauma: Secondary | ICD-10-CM | POA: Diagnosis not present

## 2021-04-03 MED ORDER — LACTATED RINGERS IV BOLUS
1000.0000 mL | Freq: Once | INTRAVENOUS | Status: AC
  Administered 2021-04-03: 1000 mL via INTRAVENOUS

## 2021-04-03 MED ORDER — PROCHLORPERAZINE EDISYLATE 10 MG/2ML IJ SOLN
10.0000 mg | Freq: Once | INTRAMUSCULAR | Status: AC
  Administered 2021-04-03: 10 mg via INTRAVENOUS
  Filled 2021-04-03: qty 2

## 2021-04-03 MED ORDER — ACETAMINOPHEN 500 MG PO TABS
1000.0000 mg | ORAL_TABLET | Freq: Once | ORAL | Status: AC
  Administered 2021-04-03: 1000 mg via ORAL
  Filled 2021-04-03: qty 2

## 2021-04-03 MED ORDER — MAGNESIUM SULFATE 2 GM/50ML IV SOLN
2.0000 g | Freq: Once | INTRAVENOUS | Status: AC
  Administered 2021-04-03: 2 g via INTRAVENOUS
  Filled 2021-04-03: qty 50

## 2021-04-03 NOTE — ED Triage Notes (Signed)
Pt comes into the ED via POV c/o migraine that has been ongoing since yesterday.  Pt states that migraines are becoming more frequent.  Pt did go to UC yesterday and they gave her an injection, but she woke up and the pain was intense again.  Pt neurologically intact at this time.  Pt admits to light sensitivity and nausea.

## 2021-04-03 NOTE — ED Provider Notes (Signed)
[facility] Provider Note    Event Date/Time   First MD Initiated Contact with Patient 04/03/21 [phone]     (approximate)   History   Migraine   HPI  Michelle Hebert is a 51 y.o. female with a past medical history of GERD, anxiety, anemia and migraine headaches who presents for assessment of right-sided headache associate with some photophobia with headache radiating towards the right side of the back of the head and upper neck.  Patient states this is typical of her usual migraine headaches although they have been flip-flopping of the last couple months.  She does feel like her headaches have been increasing in frequency.  She states her current headache started yesterday while she was at work fairly suddenly.  She went to urgent care and received some Toradol which seemed to help quite a bit but never completely alleviated her headache although she feels it is worse again today.  She denies any recent head injuries, fevers, vision changes, vertigo, extremity weakness numbness or tingling, chest pain, cough, shortness of breath, back pain, illicit drug use, daily EtOH use or other acute complaints.  She has never been treated with triptans or seeing a neurologist.  Her last menstrual period she thinks was around September of last year.      Physical Exam  Triage Vital Signs: ED Triage Vitals  Enc Vitals Group     BP 04/03/21 0813 131/87     Pulse Rate 04/03/21 0813 84     Resp 04/03/21 0813 17     Temp 04/03/21 0813 97.6 F (36.4 C)     Temp Source 04/03/21 0813 Oral     SpO2 04/03/21 0813 98 %     Weight 04/03/21 0804 199 lb 15.3 oz (90.7 kg)     Height 04/03/21 0804 5\' 4"  (1.626 m)     Head Circumference --      Peak Flow --      Pain Score 04/03/21 0804 8     Pain Loc --      Pain Edu? --      Excl. in [place]? --     Most recent vital signs: Vitals:   04/03/21 0813 04/03/21 0830  BP: 131/87 (!) 128/97  Pulse: 84 85  Resp: 17   Temp: 97.6 F (36.4  C)   SpO2: 98% 99%    General: Awake, appears mildly uncomfortable and photophobic. CV:  Good peripheral perfusion.  2+ radial pulses. Resp:  Normal effort.  Clear bilaterally. Abd:  No distention.  Other:  Cranial nerves II through XII grossly intact.  No pronator drift.  No finger dysmetria.  Symmetric 5/5 strength of all extremities.  Sensation intact to light touch in all extremities.  Unremarkable unassisted gait.  Oropharynx is unremarkable.  There is some tenderness and fairly tight muscles around the left posterior occiput without any overlying skin changes.  No tenderness along the C-spine or overlying skin changes.    ED Results / Procedures / Treatments  Labs (all labs ordered are listed, but only abnormal results are displayed) Labs Reviewed - No data to display   EKG    RADIOLOGY CT head ordered and reviewed by myself shows no evidence of ischemia, hemorrhage, mass-effect, edema, ventriculomegaly or other acute intracranial process.  Also reviewed radiology interpretation and agree with their findings.   PROCEDURES:  Critical Care performed: No  Procedures    MEDICATIONS ORDERED IN ED: Medications  magnesium sulfate IVPB 2 g 50 mL (  2 g Intravenous New Bag/Given 04/03/21 0840)  lactated ringers bolus 1,000 mL (1,000 mLs Intravenous New Bag/Given 04/03/21 0837)  prochlorperazine (COMPAZINE) injection 10 mg (10 mg Intravenous Given 04/03/21 0838)  acetaminophen (TYLENOL) tablet 1,000 mg (1,000 mg Oral Given 04/03/21 5366)     IMPRESSION / MDM / ASSESSMENT AND PLAN / ED COURSE  I reviewed the triage vital signs and the nursing notes.                              Differential diagnosis includes, but is not limited to migraine versus tension headache with lower suspicion at this time based on history, exam and CT for SAH, meningitis, venous sinus fibrosis, or other deep space infection of the head or neck.  Also have a lower suspicion for temporal arteritis at  this time.  I reviewed patient's clinic notes from her clinic visit yesterday when she received Toradol for migraine.  No other acute abnormalities noted at that time patient significant provement in her headache after Toradol.  CT head ordered and reviewed by myself shows no evidence of ischemia, hemorrhage, mass-effect, edema, ventriculomegaly or other acute intracranial process.  Also reviewed radiology interpretation and agree with their findings.  Patient received some magnesium Compazine and fluids while in emergency room.  On my reassessment she is feeling much better.  Suspect likely tension versus migraine headache.  Low suspicion for other immediate life-threatening process.  Discharged in stable condition.  Strict return precautions advised and discussed.      FINAL CLINICAL IMPRESSION(S) / ED DIAGNOSES   Final diagnoses:  Nonintractable headache, unspecified chronicity pattern, unspecified headache type     Rx / DC Orders   ED Discharge Orders     None        Note:  This document was prepared using Dragon voice recognition software and may include unintentional dictation errors.   Lucrezia Starch, MD 04/03/21 [phone]

## 2021-04-03 NOTE — ED Notes (Signed)
Pt states that her headaches have increased in frequency, states that she has been having them daily for the past 2 months, pt states that yesterday her headache had a sudden onset and the pain was severe, states that they usually gradually increase in pain and severity but yesterday's was different, states that she went to urgent care and got a shot of toradol, states that it helped but the pain never went away completely and woke up again this am with the headache that never went away from yesterday. Pt states that she has never been seen by a neurologist and states that she has never had a ct of her head, pt states that when the pain started yesterday it was the top of her head and the right side of her head and reports feeling tingly in her left arm

## 2023-04-18 IMAGING — CT CT HEAD W/O CM
4 series · 17 of 47 positions shown, 19 images · non-contrast
Comparison: None.

CLINICAL DATA: Subarachnoid hemorrhage, worsening headaches



[Series 2: head wo · axial · 0.40mm/px · z∈[+375,+490]mm · 7 of 31 slices shown, 9 images]
[im 4/31  brain]
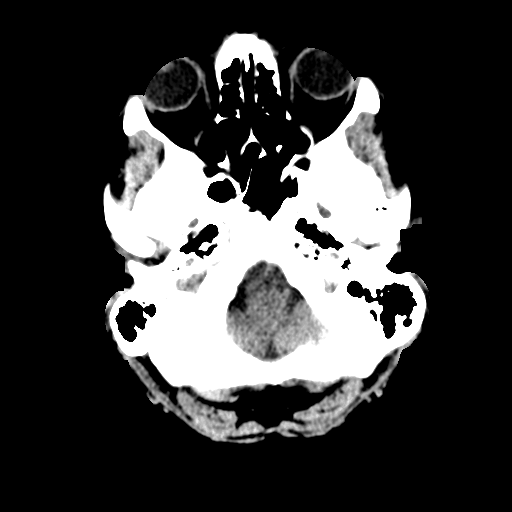
[im 4/31  bone]
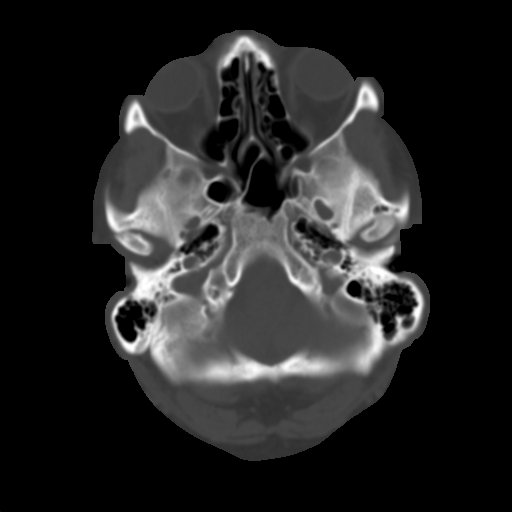
[im 8/31  brain]
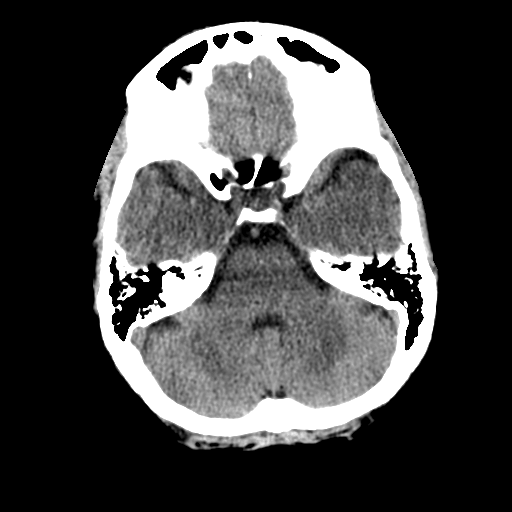
[im 12/31  brain]
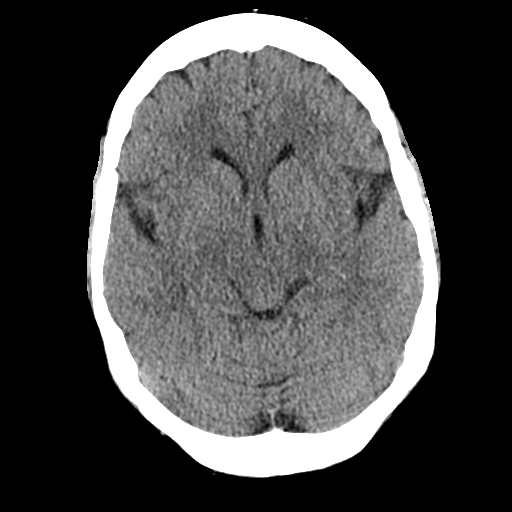
[im 16/31  brain]
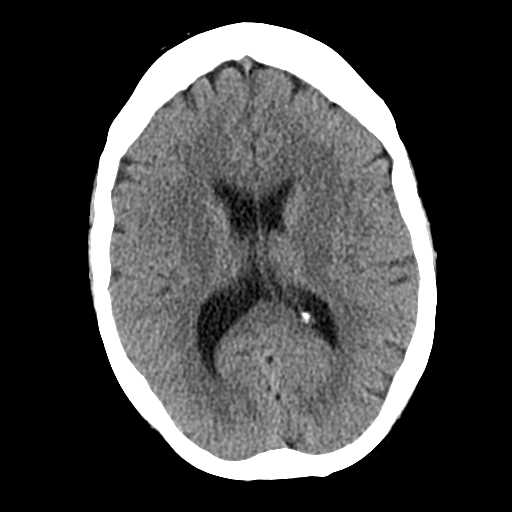
[im 19/31  brain]
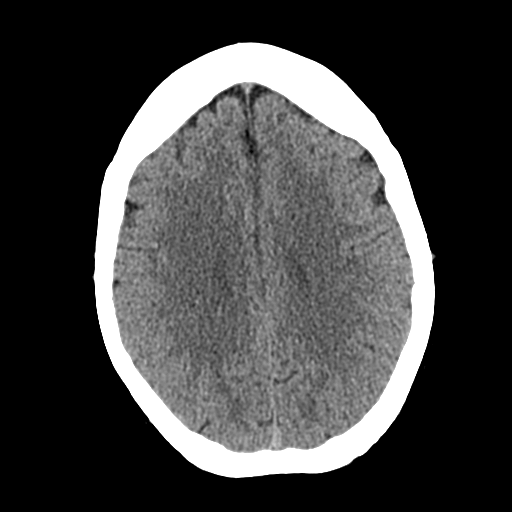
[im 19/31  bone]
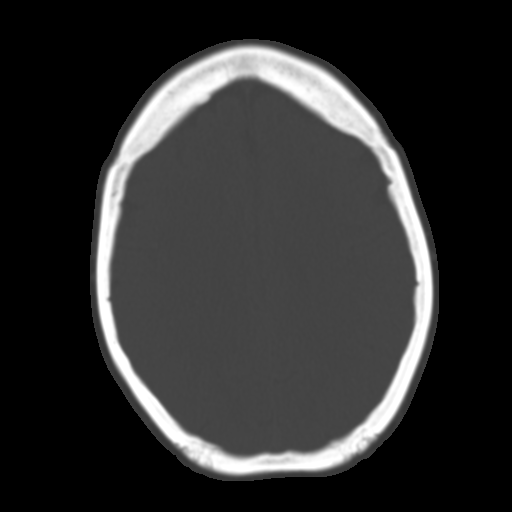
[im 23/31  brain]
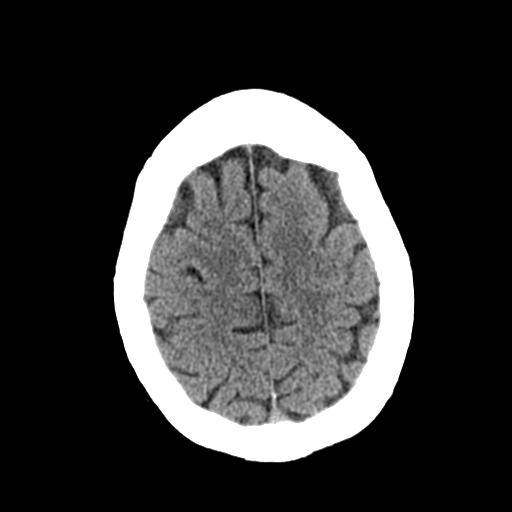
[im 27/31  brain]
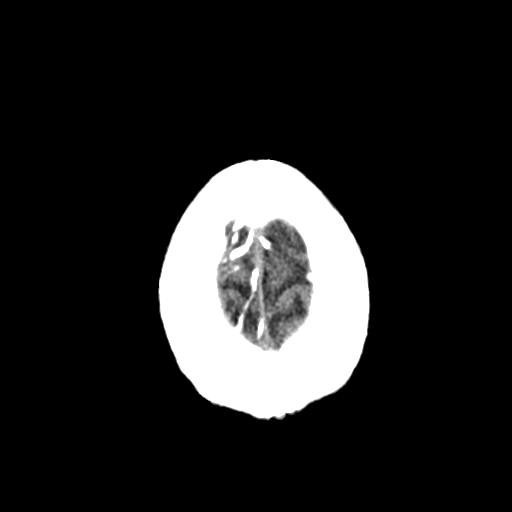

[Series 3: head bone · axial · 0.40mm/px · z∈[+374,+426]mm · 4 of 76 slices shown]
[im 8/76  bone]
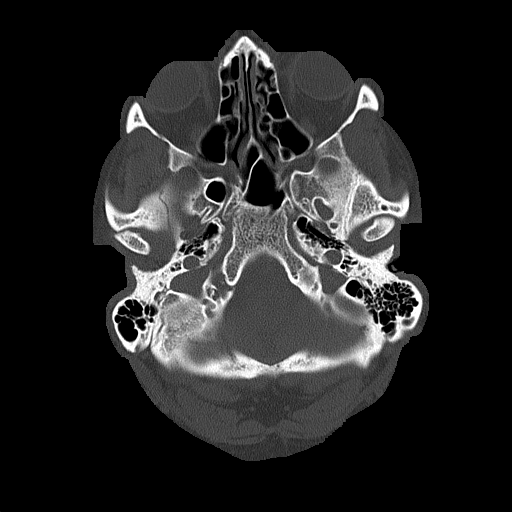
[im 16/76  bone]
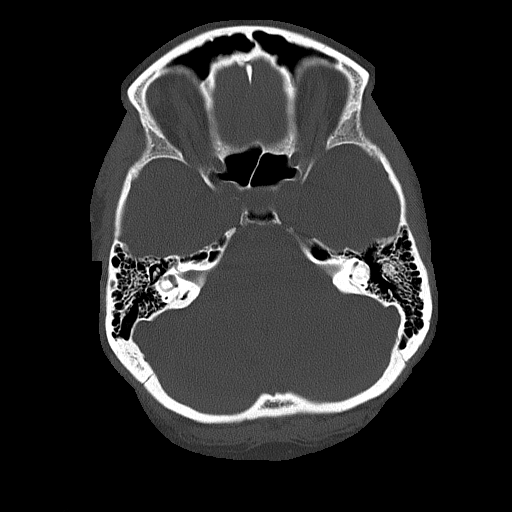
[im 23/76  bone]
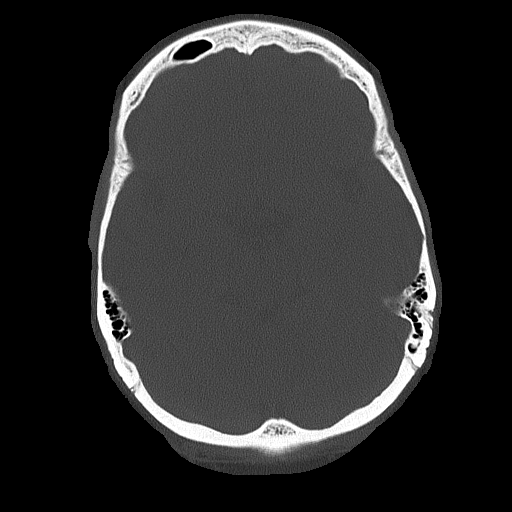
[im 34/76  bone]
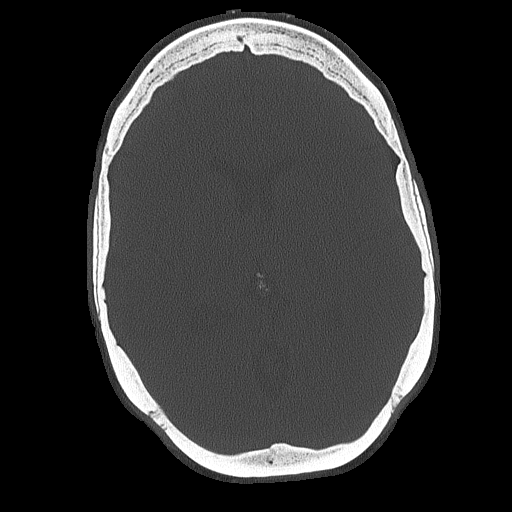

[Series 4: coronal soft tissue · coronal · 0.30mm/px · 3 of 65 slices shown]
[im 22/65  brain]
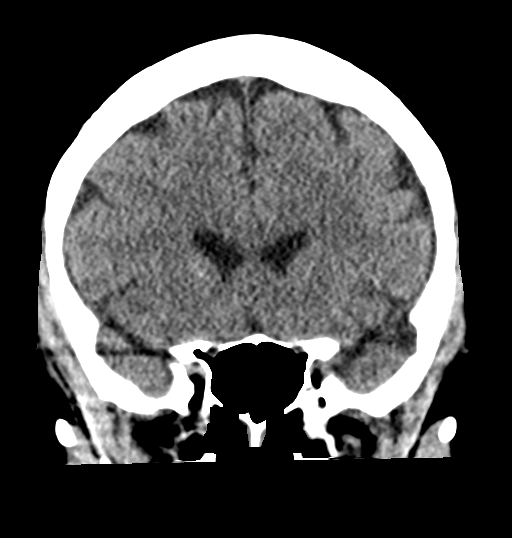
[im 29/65  brain]
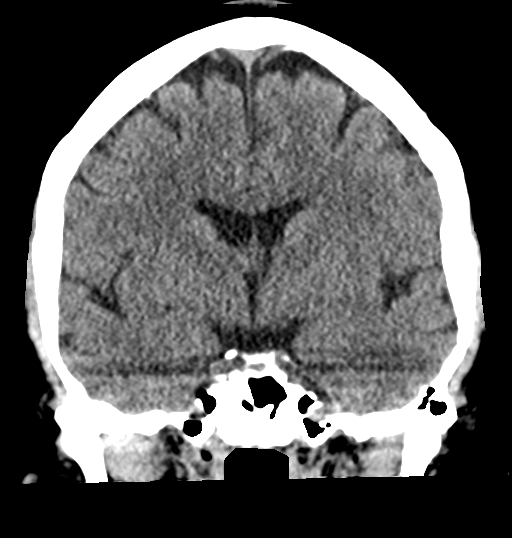
[im 36/65  brain]
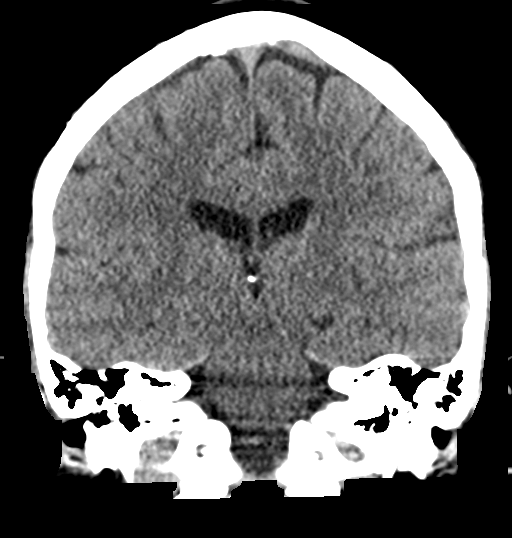

[Series 5: sagittal soft tissue · sagittal · 0.32mm/px · 3 of 52 slices shown]
[im 18/52  brain]
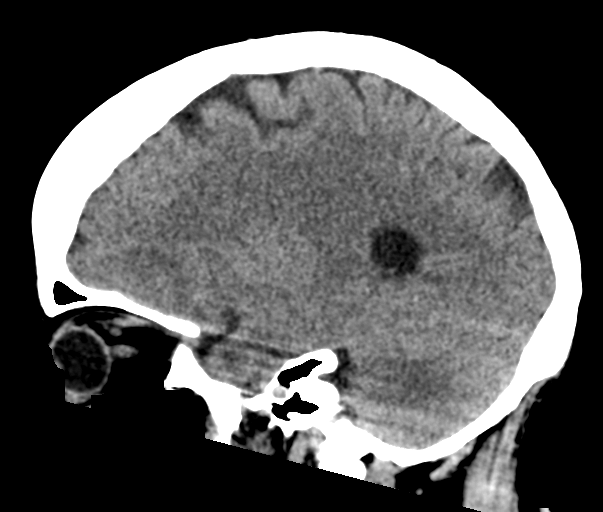
[im 26/52  brain]
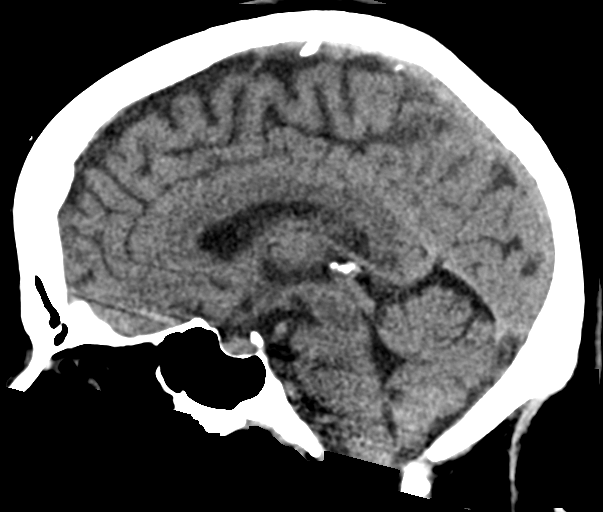
[im 35/52  brain]
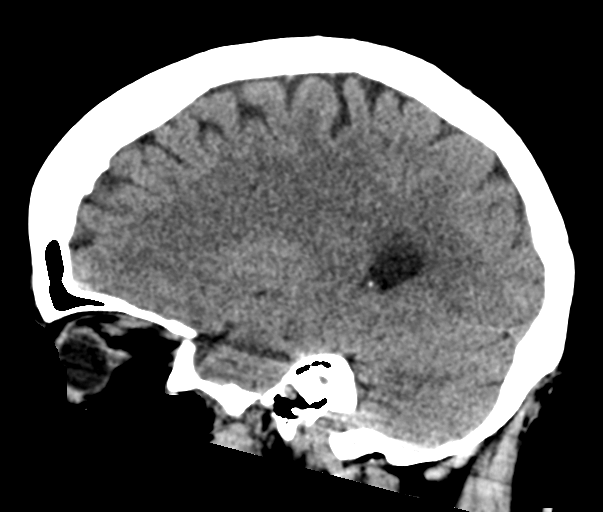

[17 of 47 positions shown; findings below may reference images not displayed]

FINDINGS: Brain: There is no acute intracranial hemorrhage, mass effect, or
edema. Gray-white differentiation is preserved. There is no
extra-axial fluid collection. Ventricles and sulci are within normal
limits in size and configuration.

Vascular: No hyperdense vessel or unexpected calcification.

Skull: Calvarium is unremarkable.

Sinuses/Orbits: No acute finding.

Other: None.
IMPRESSION: No acute intracranial abnormality.
